# Patient Record
Sex: Female | Born: 2000 | Race: White | Hispanic: No | Marital: Single | Smoking: Never smoker
Health system: Southern US, Community
[De-identification: ages and names within clinical notes are randomized; demographics above are authoritative.]

## PROBLEM LIST (undated history)

## (undated) DIAGNOSIS — D649 Anemia, unspecified: Secondary | ICD-10-CM

## (undated) DIAGNOSIS — Z789 Other specified health status: Secondary | ICD-10-CM

## (undated) DIAGNOSIS — Z8619 Personal history of other infectious and parasitic diseases: Secondary | ICD-10-CM

## (undated) HISTORY — DX: Personal history of other infectious and parasitic diseases: Z86.19

## (undated) HISTORY — DX: Anemia, unspecified: D64.9

## (undated) HISTORY — DX: Other specified health status: Z78.9

## (undated) HISTORY — PX: NO PAST SURGERIES: SHX2092

---
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
## 2021-06-12 ENCOUNTER — Other Ambulatory Visit (HOSPITAL_COMMUNITY): Payer: Self-pay | Admitting: Family Medicine

## 2021-06-12 DIAGNOSIS — N63 Unspecified lump in unspecified breast: Secondary | ICD-10-CM

## 2021-06-17 ENCOUNTER — Encounter (HOSPITAL_COMMUNITY): Payer: Self-pay

## 2021-06-17 ENCOUNTER — Other Ambulatory Visit (HOSPITAL_COMMUNITY): Payer: Self-pay | Admitting: Family Medicine

## 2021-06-17 ENCOUNTER — Other Ambulatory Visit: Payer: Self-pay

## 2021-06-17 ENCOUNTER — Ambulatory Visit (HOSPITAL_COMMUNITY)
Admission: RE | Admit: 2021-06-17 | Discharge: 2021-06-17 | Disposition: A | Discharge status: Home / Self Care | Payer: Medicaid Other | Source: Ambulatory Visit | Attending: Family Medicine | Admitting: Family Medicine

## 2021-06-17 DIAGNOSIS — N63 Unspecified lump in unspecified breast: Secondary | ICD-10-CM

## 2021-06-24 ENCOUNTER — Ambulatory Visit (HOSPITAL_COMMUNITY): Admission: RE | Admit: 2021-06-24 | Payer: Self-pay | Source: Ambulatory Visit

## 2021-06-24 ENCOUNTER — Encounter (HOSPITAL_COMMUNITY): Payer: Self-pay

## 2021-07-01 ENCOUNTER — Other Ambulatory Visit: Payer: Self-pay | Admitting: Obstetrics & Gynecology

## 2021-07-01 DIAGNOSIS — O3680X0 Pregnancy with inconclusive fetal viability, not applicable or unspecified: Secondary | ICD-10-CM

## 2021-07-02 ENCOUNTER — Ambulatory Visit (INDEPENDENT_AMBULATORY_CARE_PROVIDER_SITE_OTHER): Payer: Medicaid Other

## 2021-07-02 ENCOUNTER — Other Ambulatory Visit: Payer: Self-pay

## 2021-07-02 DIAGNOSIS — Z3A08 8 weeks gestation of pregnancy: Secondary | ICD-10-CM

## 2021-07-02 DIAGNOSIS — O3680X0 Pregnancy with inconclusive fetal viability, not applicable or unspecified: Secondary | ICD-10-CM | POA: Diagnosis not present

## 2021-07-02 NOTE — Progress Notes (Signed)
[place] 8 wks,single IUP with YS,positive fht 140 bpm,normal ovaries,CRL 12.10 mm

## 2021-07-22 ENCOUNTER — Ambulatory Visit (HOSPITAL_COMMUNITY)
Admission: RE | Admit: 2021-07-22 | Discharge: 2021-07-22 | Disposition: A | Discharge status: Home / Self Care | Payer: Medicaid Other | Source: Ambulatory Visit | Attending: Family Medicine | Admitting: Family Medicine

## 2021-07-22 ENCOUNTER — Encounter (HOSPITAL_COMMUNITY): Payer: Self-pay

## 2021-07-22 ENCOUNTER — Other Ambulatory Visit: Payer: Self-pay

## 2021-07-22 DIAGNOSIS — N63 Unspecified lump in unspecified breast: Secondary | ICD-10-CM

## 2021-07-22 MED ORDER — LIDOCAINE HCL (PF) 2 % IJ SOLN
INTRAMUSCULAR | Status: AC
  Administered 2021-07-22: 10 mL
  Filled 2021-07-22: qty 10

## 2021-07-22 MED ORDER — SODIUM BICARBONATE 4.2 % IV SOLN
INTRAVENOUS | Status: AC
  Administered 2021-07-22: 2.5 meq
  Filled 2021-07-22: qty 10

## 2021-07-22 MED ORDER — LIDOCAINE-EPINEPHRINE (PF) 1 %-1:200000 IJ SOLN
INTRAMUSCULAR | Status: AC
  Administered 2021-07-22: 5 mL
  Filled 2021-07-22: qty 30

## 2021-07-22 NOTE — Progress Notes (Signed)
PT tolerated left breast biopsy and clip placement well today with no acute distress noted. PT verbalized understanding of discharge instructions and given an ice pack and paper discharge instructions. Samples for lab collected by MD and sent to the lab at this time by ultrasound tech.

## 2021-07-23 LAB — SURGICAL PATHOLOGY

## 2021-08-05 ENCOUNTER — Other Ambulatory Visit: Payer: Self-pay | Admitting: Obstetrics & Gynecology

## 2021-08-05 DIAGNOSIS — Z3682 Encounter for antenatal screening for nuchal translucency: Secondary | ICD-10-CM

## 2021-08-05 DIAGNOSIS — Z3400 Encounter for supervision of normal first pregnancy, unspecified trimester: Secondary | ICD-10-CM | POA: Insufficient documentation

## 2021-08-06 ENCOUNTER — Ambulatory Visit: Payer: Self-pay | Admitting: *Deleted

## 2021-08-06 ENCOUNTER — Encounter: Payer: Self-pay | Admitting: Advanced Practice Midwife

## 2021-08-06 ENCOUNTER — Other Ambulatory Visit: Payer: Self-pay

## 2021-08-06 ENCOUNTER — Ambulatory Visit (INDEPENDENT_AMBULATORY_CARE_PROVIDER_SITE_OTHER): Payer: Self-pay

## 2021-08-06 ENCOUNTER — Ambulatory Visit (INDEPENDENT_AMBULATORY_CARE_PROVIDER_SITE_OTHER): Payer: Medicaid Other | Admitting: Advanced Practice Midwife

## 2021-08-06 VITALS — BP 108/62 | HR 82 | Ht 64.0 in | Wt 118.0 lb

## 2021-08-06 DIAGNOSIS — Z31430 Encounter of female for testing for genetic disease carrier status for procreative management: Secondary | ICD-10-CM | POA: Diagnosis not present

## 2021-08-06 DIAGNOSIS — Z3A13 13 weeks gestation of pregnancy: Secondary | ICD-10-CM

## 2021-08-06 DIAGNOSIS — Z3402 Encounter for supervision of normal first pregnancy, second trimester: Secondary | ICD-10-CM

## 2021-08-06 DIAGNOSIS — Z3401 Encounter for supervision of normal first pregnancy, first trimester: Secondary | ICD-10-CM

## 2021-08-06 DIAGNOSIS — Z3682 Encounter for antenatal screening for nuchal translucency: Secondary | ICD-10-CM

## 2021-08-06 DIAGNOSIS — Z363 Encounter for antenatal screening for malformations: Secondary | ICD-10-CM

## 2021-08-06 LAB — POCT URINALYSIS DIPSTICK OB
Blood, UA: NEGATIVE
Glucose, UA: NEGATIVE
Ketones, UA: NEGATIVE
Leukocytes, UA: NEGATIVE
Nitrite, UA: NEGATIVE
POC,PROTEIN,UA: NEGATIVE

## 2021-08-06 MED ORDER — BLOOD PRESSURE MONITOR MISC
0 refills | Status: DC
Start: 2021-08-06 — End: 2023-12-02

## 2021-08-06 NOTE — Patient Instructions (Signed)
Barb, thank you for choosing our office today! We appreciate the opportunity to meet your healthcare needs. You may receive a short survey by mail, e-mail, or through MyChart. If you are happy with your care we would appreciate if you could take just a few minutes to complete the survey questions. We read all of your comments and take your feedback very seriously. Thank you again for choosing our office.  [facility] [place] ([address] [place],  [address]) Entrance C, located off of [address] Free 24/7 valet parking   Nausea & Vomiting Have saltine crackers or pretzels by your bed and eat a few bites before you raise your head out of bed in the morning Eat small frequent meals throughout the day instead of large meals Drink plenty of fluids throughout the day to stay hydrated, just don't drink a lot of fluids with your meals.  This can make your stomach fill up faster making you feel sick Do not brush your teeth right after you eat Products with real ginger are good for nausea, like ginger ale and ginger hard candy Make sure it says made with real ginger! Sucking on sour candy like lemon heads is also good for nausea If your prenatal vitamins make you nauseated, take them at night so you will sleep through the nausea Sea Bands If you feel like you need medicine for the nausea & vomiting please let us know If you are unable to keep any fluids or food down please let us know   Constipation Drink plenty of fluid, preferably water, throughout the day Eat foods high in fiber such as fruits, vegetables, and grains Exercise, such as walking, is a good way to keep your bowels regular Drink warm fluids, especially warm prune juice, or decaf coffee Eat a 1/2 cup of real oatmeal (not instant), 1/2 cup applesauce, and 1/2-1 cup warm prune juice every day If needed, you may take Colace (docusate sodium) stool softener  once or twice a day to help keep the stool soft.  If you still are having problems with constipation, you may take Miralax once daily as needed to help keep your bowels regular.   Home Blood Pressure Monitoring for Patients   Your provider has recommended that you check your blood pressure (BP) at least once a week at home. If you do not have a blood pressure cuff at home, one will be provided for you. Contact your provider if you have not received your monitor within 1 week.   Helpful Tips for Accurate Home Blood Pressure Checks  Don't smoke, exercise, or drink caffeine 30 minutes before checking your BP Use the restroom before checking your BP (a full bladder can raise your pressure) Relax in a comfortable upright chair Feet on the ground Left arm resting comfortably on a flat surface at the level of your heart Legs uncrossed Back supported Sit quietly and don't talk Place the cuff on your bare arm Adjust snuggly, so that only two fingertips can fit between your skin and the top of the cuff Check 2 readings separated by at least one minute Keep a log of your BP readings For a visual, please reference this diagram: http://ccnc.care/bpdiagram  Provider Name: [facility]     Phone: [phone]  Zone 1: ALL CLEAR  Continue to monitor your symptoms:  BP reading is less than 140 (top number) or less than 90 (bottom   number)  No right upper stomach pain No headaches or seeing spots No feeling nauseated or throwing up No swelling in face and hands  Zone 2: CAUTION Call your doctor's office for any of the following:  BP reading is greater than 140 (top number) or greater than 90 (bottom number)  Stomach pain under your ribs in the middle or right side Headaches or seeing spots Feeling nauseated or throwing up Swelling in face and hands  Zone 3: EMERGENCY  Seek immediate medical care if you have any of the following:  BP reading is greater than160 (top number) or greater than  110 (bottom number) Severe headaches not improving with Tylenol Serious difficulty catching your breath Any worsening symptoms from Zone 2    First Trimester of Pregnancy The first trimester of pregnancy is from week 1 until the end of week 12 (months 1 through 3). A week after a sperm fertilizes an egg, the egg will implant on the wall of the uterus. This embryo will begin to develop into a baby. Genes from you and your partner are forming the baby. The female genes determine whether the baby is a boy or a girl. At 6-8 weeks, the eyes and face are formed, and the heartbeat can be seen on ultrasound. At the end of 12 weeks, all the baby's organs are formed.  Now that you are pregnant, you will want to do everything you can to have a healthy baby. Two of the most important things are to get good prenatal care and to follow your health care provider's instructions. Prenatal care is all the medical care you receive before the baby's birth. This care will help prevent, find, and treat any problems during the pregnancy and childbirth. BODY CHANGES Your body goes through many changes during pregnancy. The changes vary from woman to woman.  You may gain or lose a couple of pounds at first. You may feel sick to your stomach (nauseous) and throw up (vomit). If the vomiting is uncontrollable, call your health care provider. You may tire easily. You may develop headaches that can be relieved by medicines approved by your health care provider. You may urinate more often. Painful urination may mean you have a bladder infection. You may develop heartburn as a result of your pregnancy. You may develop constipation because certain hormones are causing the muscles that push waste through your intestines to slow down. You may develop hemorrhoids or swollen, bulging veins (varicose veins). Your breasts may begin to grow larger and become tender. Your nipples may stick out more, and the tissue that surrounds them  (areola) may become darker. Your gums may bleed and may be sensitive to brushing and flossing. Dark spots or blotches (chloasma, mask of pregnancy) may develop on your face. This will likely fade after the baby is born. Your menstrual periods will stop. You may have a loss of appetite. You may develop cravings for certain kinds of food. You may have changes in your emotions from day to day, such as being excited to be pregnant or being concerned that something may go wrong with the pregnancy and baby. You may have more vivid and strange dreams. You may have changes in your hair. These can include thickening of your hair, rapid growth, and changes in texture. Some women also have hair loss during or after pregnancy, or hair that feels dry or thin. Your hair will most likely return to normal after your baby is born. WHAT TO EXPECT AT YOUR PRENATAL   VISITS During a routine prenatal visit: You will be weighed to make sure you and the baby are growing normally. Your blood pressure will be taken. Your abdomen will be measured to track your baby's growth. The fetal heartbeat will be listened to starting around week 10 or 12 of your pregnancy. Test results from any previous visits will be discussed. Your health care provider may ask you: How you are feeling. If you are feeling the baby move. If you have had any abnormal symptoms, such as leaking fluid, bleeding, severe headaches, or abdominal cramping. If you have any questions. Other tests that may be performed during your first trimester include: Blood tests to find your blood type and to check for the presence of any previous infections. They will also be used to check for low iron levels (anemia) and Rh antibodies. Later in the pregnancy, blood tests for diabetes will be done along with other tests if problems develop. Urine tests to check for infections, diabetes, or protein in the urine. An ultrasound to confirm the proper growth and development  of the baby. An amniocentesis to check for possible genetic problems. Fetal screens for spina bifida and Down syndrome. You may need other tests to make sure you and the baby are doing well. HOME CARE INSTRUCTIONS  Medicines Follow your health care provider's instructions regarding medicine use. Specific medicines may be either safe or unsafe to take during pregnancy. Take your prenatal vitamins as directed. If you develop constipation, try taking a stool softener if your health care provider approves. Diet Eat regular, well-balanced meals. Choose a variety of foods, such as meat or vegetable-based protein, fish, milk and low-fat dairy products, vegetables, fruits, and whole grain breads and cereals. Your health care provider will help you determine the amount of weight gain that is right for you. Avoid raw meat and uncooked cheese. These carry germs that can cause birth defects in the baby. Eating four or five small meals rather than three large meals a day may help relieve nausea and vomiting. If you start to feel nauseous, eating a few soda crackers can be helpful. Drinking liquids between meals instead of during meals also seems to help nausea and vomiting. If you develop constipation, eat more high-fiber foods, such as fresh vegetables or fruit and whole grains. Drink enough fluids to keep your urine clear or pale yellow. Activity and Exercise Exercise only as directed by your health care provider. Exercising will help you: Control your weight. Stay in shape. Be prepared for labor and delivery. Experiencing pain or cramping in the lower abdomen or low back is a good sign that you should stop exercising. Check with your health care provider before continuing normal exercises. Try to avoid standing for long periods of time. Move your legs often if you must stand in one place for a long time. Avoid heavy lifting. Wear low-heeled shoes, and practice good posture. You may continue to have sex  unless your health care provider directs you otherwise. Relief of Pain or Discomfort Wear a good support bra for breast tenderness.   Take warm sitz baths to soothe any pain or discomfort caused by hemorrhoids. Use hemorrhoid cream if your health care provider approves.   Rest with your legs elevated if you have leg cramps or low back pain. If you develop varicose veins in your legs, wear support hose. Elevate your feet for 15 minutes, 3-4 times a day. Limit salt in your diet. Prenatal Care Schedule your prenatal visits by the   twelfth week of pregnancy. They are usually scheduled monthly at first, then more often in the last 2 months before delivery. Write down your questions. Take them to your prenatal visits. Keep all your prenatal visits as directed by your health care provider. Safety Wear your seat belt at all times when driving. Make a list of emergency phone numbers, including numbers for family, friends, the hospital, and police and fire departments. General Tips Ask your health care provider for a referral to a local prenatal education class. Begin classes no later than at the beginning of month 6 of your pregnancy. Ask for help if you have counseling or nutritional needs during pregnancy. Your health care provider can offer advice or refer you to specialists for help with various needs. Do not use hot tubs, steam rooms, or saunas. Do not douche or use tampons or scented sanitary pads. Do not cross your legs for long periods of time. Avoid cat litter boxes and soil used by cats. These carry germs that can cause birth defects in the baby and possibly loss of the fetus by miscarriage or stillbirth. Avoid all smoking, herbs, alcohol, and medicines not prescribed by your health care provider. Chemicals in these affect the formation and growth of the baby. Schedule a dentist appointment. At home, brush your teeth with a soft toothbrush and be gentle when you floss. SEEK MEDICAL CARE IF:   You have dizziness. You have mild pelvic cramps, pelvic pressure, or nagging pain in the abdominal area. You have persistent nausea, vomiting, or diarrhea. You have a bad smelling vaginal discharge. You have pain with urination. You notice increased swelling in your face, hands, legs, or ankles. SEEK IMMEDIATE MEDICAL CARE IF:  You have a fever. You are leaking fluid from your vagina. You have spotting or bleeding from your vagina. You have severe abdominal cramping or pain. You have rapid weight gain or loss. You vomit blood or material that looks like coffee grounds. You are exposed to German measles and have never had them. You are exposed to fifth disease or chickenpox. You develop a severe headache. You have shortness of breath. You have any kind of trauma, such as from a fall or a car accident. Document Released: 09/08/2001 Document Revised: 01/29/2014 Document Reviewed: 07/25/2013 [employer] Patient Information 2015 [employer]. This information is not intended to replace advice given to you by your health care provider. Make sure you discuss any questions you have with your health care provider.  

## 2021-08-06 NOTE — Progress Notes (Signed)
[place] 13 wks,measurements c/w dates,CRL 64.54 mm,NB present,NT 1.5 mm,normal ovaries,fhr 159 bpm,posterior placenta

## 2021-08-06 NOTE — Progress Notes (Signed)
INITIAL OBSTETRICAL VISIT Patient name: Megan Sandoval MRN [id number]  Date of birth: 06-Apr-2001 Chief Complaint:   Initial Prenatal Visit  History of Present Illness:   Megan Sandoval is a 20 y.o. G1P0 Caucasian female at [redacted]w[redacted]d by LMP c/w u/s at 8.0 weeks with an Estimated Date of Delivery: 02/11/22 being seen today for her initial obstetrical visit.   Patient's last menstrual period was 05/07/2021 (exact date). Her obstetrical history is significant for primigravida.   Today she reports no complaints.  Last pap <21yo. Results were: N/A  Depression screen [facility] 2/9 08/06/2021  Decreased Interest 1  Down, Depressed, Hopeless 1  PHQ - 2 Score 2  Altered sleeping 0  Tired, decreased energy 1  Change in appetite 3  Feeling bad or failure about yourself  0  Trouble concentrating 0  Moving slowly or fidgety/restless 0  Suicidal thoughts 0  PHQ-9 Score 6     GAD 7 : Generalized Anxiety Score 08/06/2021  Nervous, Anxious, on Edge 1  Control/stop worrying 1  Worry too much - different things 1  Trouble relaxing 1  Restless 1  Easily annoyed or irritable 1  Afraid - awful might happen 0  Total GAD 7 Score 6     Review of Systems:   Pertinent items are noted in HPI Denies cramping/contractions, leakage of fluid, vaginal bleeding, abnormal vaginal discharge w/ itching/odor/irritation, headaches, visual changes, shortness of breath, chest pain, abdominal pain, severe nausea/vomiting, or problems with urination or bowel movements unless otherwise stated above.  Pertinent History Reviewed:  Reviewed past medical,surgical, social, obstetrical and family history.  Reviewed problem list, medications and allergies. OB History  Gravida Para Term Preterm AB Living  1            SAB IAB Ectopic Multiple Live Births               # Outcome Date GA Lbr Len/2nd Weight Sex Delivery Anes PTL Lv  1 Current            Physical Assessment:   Vitals:   08/06/21 1336  BP: 108/62  Pulse:  82  Weight: 118 lb (53.5 kg)  There is no height or weight on file to calculate BMI.       Physical Examination:  General appearance - well appearing, and in no distress  Mental status - alert, oriented to person, place, and time  Psych:  She has a normal mood and affect  Skin - warm and dry, normal color, no suspicious lesions noted  Chest - effort normal, all lung fields clear to auscultation bilaterally  Heart - normal rate and regular rhythm  Abdomen - soft, nontender  Extremities:  No swelling or varicosities noted  Pelvic - not indicated  Thin prep pap is not done    TODAY'S NT Korea 13 wks,measurements c/w dates,CRL 64.54 mm,NB present,NT 1.5 mm,normal ovaries,fhr 159 bpm,posterior placenta   Results for orders placed or performed in visit on 08/06/21 (from the past 24 hour(s))  POC Urinalysis Dipstick OB   Collection Time: 08/06/21  2:42 PM  Result Value Ref Range   Color, UA     Clarity, UA     Glucose, UA Negative Negative   Bilirubin, UA     Ketones, UA neg    Spec Grav, UA     Blood, UA neg    pH, UA     POC,PROTEIN,UA Negative Negative, Trace, Small (1+), Moderate (2+), Large (3+), 4+   Urobilinogen,  UA     Nitrite, UA neg    Leukocytes, UA Negative Negative   Appearance     Odor      Assessment & Plan:  1) Low-Risk Pregnancy G1P0 at [redacted]w[redacted]d with an Estimated Date of Delivery: 02/11/22   2) Initial OB visit   Meds:  Meds ordered this encounter  Medications   Blood Pressure Monitor MISC    Sig: For regular home bp monitoring during pregnancy    Dispense:  1 each    Refill:  0    Z34.81 Please mail to patient    Initial labs obtained Continue prenatal vitamins Reviewed n/v relief measures and warning s/s to report Reviewed recommended weight gain based on pre-gravid BMI Encouraged well-balanced diet Genetic & carrier screening discussed: requests Panorama and NT/IT, requests Horizon  Ultrasound discussed; fetal survey: requested CCNC completed> form  faxed if has or is planning to apply for medicaid The nature of [place] [facility] [employer] with multiple MDs and other Advanced Practice Providers was explained to patient; also emphasized that fellows, residents, and students are part of our team. Does not have home bp cuff. Office bp cuff given: no. Rx sent: yes. Check bp weekly, let us know if consistently >140/90.   No indications for ASA therapy or early HgbA1c (per uptodate)   Follow-up: Return in about 4 weeks (around 09/03/2021) for 4wk LROB, 2nd IT, in person; then 7wk LROB with anatomy u/s.   Orders Placed This Encounter  Procedures   Urine Culture   GC/Chlamydia Probe Amp   US OB Comp + 14 Wk   Integrated 1   Pain Management Screening Profile (10S)   Genetic Screening   CBC/D/Plt+RPR+Rh+ABO+RubIgG...   POC Urinalysis Dipstick OB    Arabella Merles [facility] 08/06/2021 2:59 PM

## 2021-08-07 LAB — MED LIST OPTION NOT SELECTED

## 2021-08-08 LAB — PMP SCREEN PROFILE (10S), URINE
Amphetamine Scrn, Ur: NEGATIVE ng/mL
BARBITURATE SCREEN URINE: NEGATIVE ng/mL
BENZODIAZEPINE SCREEN, URINE: NEGATIVE ng/mL
CANNABINOIDS UR QL SCN: POSITIVE ng/mL — AB
Cocaine (Metab) Scrn, Ur: NEGATIVE ng/mL
Creatinine(Crt), U: 64.5 mg/dL (ref 20.0–300.0)
Methadone Screen, Urine: NEGATIVE ng/mL
OXYCODONE+OXYMORPHONE UR QL SCN: NEGATIVE ng/mL
Opiate Scrn, Ur: NEGATIVE ng/mL
Ph of Urine: 7.3 (ref 4.5–8.9)
Phencyclidine Qn, Ur: NEGATIVE ng/mL
Propoxyphene Scrn, Ur: NEGATIVE ng/mL

## 2021-08-08 LAB — CBC/D/PLT+RPR+RH+ABO+RUBIGG...
Antibody Screen: NEGATIVE
Basophils Absolute: 0 10*3/uL (ref 0.0–0.2)
Basos: 0 %
EOS (ABSOLUTE): 0 10*3/uL (ref 0.0–0.4)
Eos: 1 %
HCV Ab: <0.1 s/co ratio (ref 0.0–0.9)
HIV Screen 4th Generation wRfx: NONREACTIVE
Hematocrit: 32.3 % — ABNORMAL LOW (ref 34.0–46.6)
Hemoglobin: 11.3 g/dL (ref 11.1–15.9)
Hepatitis B Surface Ag: NEGATIVE
Immature Grans (Abs): 0 10*3/uL (ref 0.0–0.1)
Immature Granulocytes: 0 %
Lymphocytes Absolute: 1.7 10*3/uL (ref 0.7–3.1)
Lymphs: 22 %
MCH: 30.3 pg (ref 26.6–33.0)
MCHC: 35 g/dL (ref 31.5–35.7)
MCV: 87 fL (ref 79–97)
Monocytes Absolute: 0.5 10*3/uL (ref 0.1–0.9)
Monocytes: 6 %
Neutrophils Absolute: 5.3 10*3/uL (ref 1.4–7.0)
Neutrophils: 71 %
Platelets: 274 10*3/uL (ref 150–450)
RBC: 3.73 x10E6/uL — ABNORMAL LOW (ref 3.77–5.28)
RDW: 11.7 % (ref 11.7–15.4)
RPR Ser Ql: NONREACTIVE
Rh Factor: POSITIVE
Rubella Antibodies, IGG: 1.17 index (ref >0.99)
WBC: 7.5 10*3/uL (ref 3.4–10.8)

## 2021-08-08 LAB — INTEGRATED 1
Crown Rump Length: 64.5 mm
Gest. Age on Collection Date: 12.6 weeks
Maternal Age at EDD: 20 yr
Nuchal Translucency (NT): 1.5 mm
Number of Fetuses: 1
PAPP-A Value: 3321.9 ng/mL
Weight: 118 [lb_av]

## 2021-08-08 LAB — GC/CHLAMYDIA PROBE AMP
Chlamydia trachomatis, NAA: NEGATIVE
Neisseria Gonorrhoeae by PCR: NEGATIVE

## 2021-08-08 LAB — HCV INTERPRETATION

## 2021-08-12 ENCOUNTER — Other Ambulatory Visit: Payer: Self-pay | Admitting: Advanced Practice Midwife

## 2021-08-12 ENCOUNTER — Encounter: Payer: Self-pay | Admitting: Advanced Practice Midwife

## 2021-08-12 ENCOUNTER — Telehealth: Payer: Self-pay | Admitting: *Deleted

## 2021-08-12 DIAGNOSIS — R8271 Bacteriuria: Secondary | ICD-10-CM | POA: Insufficient documentation

## 2021-08-12 DIAGNOSIS — F1290 Cannabis use, unspecified, uncomplicated: Secondary | ICD-10-CM | POA: Insufficient documentation

## 2021-08-12 LAB — URINE CULTURE

## 2021-08-12 MED ORDER — NITROFURANTOIN MONOHYD MACRO 100 MG PO CAPS: 100.0000 mg | ORAL_CAPSULE | Freq: Two times a day (BID) | ORAL | 0 refills | Status: DC

## 2021-08-12 NOTE — Telephone Encounter (Signed)
Patient notified of urine culture positive for e-coli and antibiotics have been sent in.  Advised to take all of the medication and urine will be sent at next visit for POC.  Pt verbalized understanding.

## 2021-08-19 ENCOUNTER — Encounter: Payer: Self-pay | Admitting: Advanced Practice Midwife

## 2021-09-03 ENCOUNTER — Ambulatory Visit (INDEPENDENT_AMBULATORY_CARE_PROVIDER_SITE_OTHER): Payer: Medicaid Other | Admitting: Advanced Practice Midwife

## 2021-09-03 ENCOUNTER — Other Ambulatory Visit: Payer: Self-pay

## 2021-09-03 VITALS — BP 97/56 | HR 88 | Wt 119.0 lb

## 2021-09-03 DIAGNOSIS — Z3A17 17 weeks gestation of pregnancy: Secondary | ICD-10-CM

## 2021-09-03 DIAGNOSIS — Z1379 Encounter for other screening for genetic and chromosomal anomalies: Secondary | ICD-10-CM

## 2021-09-03 DIAGNOSIS — R8271 Bacteriuria: Secondary | ICD-10-CM

## 2021-09-03 DIAGNOSIS — F1290 Cannabis use, unspecified, uncomplicated: Secondary | ICD-10-CM

## 2021-09-03 DIAGNOSIS — O2342 Unspecified infection of urinary tract in pregnancy, second trimester: Secondary | ICD-10-CM

## 2021-09-03 DIAGNOSIS — Z3402 Encounter for supervision of normal first pregnancy, second trimester: Secondary | ICD-10-CM

## 2021-09-03 NOTE — Patient Instructions (Signed)
Megan Sandoval, thank you for choosing our office today! We appreciate the opportunity to meet your healthcare needs. You may receive a short survey by mail, e-mail, or through MyChart. If you are happy with your care we would appreciate if you could take just a few minutes to complete the survey questions. We read all of your comments and take your feedback very seriously. Thank you again for choosing our office.  [facility] Team at [facility] ([address] [place], [place] [address]) Entrance C, located off of [address] Free 24/7 valet parking  Go to [email or web address] to register for FREE online childbirth classes  Call the office ([phone]) or go to [facility] if: You begin to severe cramping Your water breaks.  Sometimes it is a big gush of fluid, sometimes it is just a trickle that keeps getting your panties wet or running down your legs You have vaginal bleeding.  It is normal to have a small amount of spotting if your cervix was checked.   [place] Pediatricians/Family Doctors [facility] ([facility]): [address], [phone]           [facility]: [address], [phone]                [facility] ([facility]): [address], [phone] (call to ask if accepting patients) [facility]: [address], [place], [phone]    [place] Pediatricians/Family Doctors [facility] ([facility]): [address], [phone] [facility]: [address], [phone] [facility]: [address], [phone]  [place] [facility]  [facility] ([facility]): [phone] [facility]: [address], [phone]   [place] Family Doctors [facility]: [address], [phone]  [place] [facility]  [facility]: [phone], [phone]  Home Blood Pressure Monitoring for Patients   Your provider has recommended that you check your blood pressure (BP) at least once a week at home. If you do not have a blood pressure cuff at home, one will be provided for you. Contact your provider if you have not received your monitor within 1 week.   Helpful Tips for Accurate Home Blood Pressure Checks  Don't smoke, exercise, or drink caffeine 30 minutes before checking your BP Use the restroom before checking your BP (a full bladder can raise your pressure) Relax in a comfortable upright chair Feet on the ground Left arm resting comfortably on a flat surface at the level of your heart Legs uncrossed Back supported Sit quietly and don't talk Place the cuff on your bare arm Adjust snuggly, so that only two fingertips can fit between your skin and the top of the cuff Check 2 readings separated by at least one minute Keep a log of your BP readings For a visual, please reference this diagram: http://ccnc.care/bpdiagram  Provider Name: [facility]     Phone: [phone]  Zone 1: ALL CLEAR  Continue to monitor your symptoms:  BP reading is less than 140 (top number) or less than 90 (bottom number)  No right upper stomach pain No headaches or seeing spots No feeling nauseated or throwing up No swelling in face and hands  Zone 2: CAUTION Call your doctor's office for any of the following:  BP reading is greater than 140 (top number) or greater than   90 (bottom number)  Stomach pain under your ribs in the middle or right side Headaches or seeing spots Feeling nauseated or throwing up Swelling in face and hands  Zone 3: EMERGENCY  Seek immediate medical care if you have any of the following:  BP reading is greater than160 (top number) or greater than 110 (bottom number) Severe headaches not improving with Tylenol Serious difficulty catching your breath Any worsening symptoms from  Zone 2     Second Trimester of Pregnancy The second trimester is from week 14 through week 27 (months 4 through 6). The second trimester is often a time when you feel your best. Your body has adjusted to being pregnant, and you begin to feel better physically. Usually, morning sickness has lessened or quit completely, you may have more energy, and you may have an increase in appetite. The second trimester is also a time when the fetus is growing rapidly. At the end of the sixth month, the fetus is about 9 inches long and weighs about 1 pounds. You will likely begin to feel the baby move (quickening) between 16 and 20 weeks of pregnancy. Body changes during your second trimester Your body continues to go through many changes during your second trimester. The changes vary from woman to woman. Your weight will continue to increase. You will notice your lower abdomen bulging out. You may begin to get stretch marks on your hips, abdomen, and breasts. You may develop headaches that can be relieved by medicines. The medicines should be approved by your health care provider. You may urinate more often because the fetus is pressing on your bladder. You may develop or continue to have heartburn as a result of your pregnancy. You may develop constipation because certain hormones are causing the muscles that push waste through your intestines to slow down. You may develop hemorrhoids or swollen, bulging veins (varicose veins). You may have back pain. This is caused by: Weight gain. Pregnancy hormones that are relaxing the joints in your pelvis. A shift in weight and the muscles that support your balance. Your breasts will continue to grow and they will continue to become tender. Your gums may bleed and may be sensitive to brushing and flossing. Dark spots or blotches (chloasma, mask of pregnancy) may develop on your face. This will likely fade after the baby is born. A dark line from your belly button to  the pubic area (linea nigra) may appear. This will likely fade after the baby is born. You may have changes in your hair. These can include thickening of your hair, rapid growth, and changes in texture. Some women also have hair loss during or after pregnancy, or hair that feels dry or thin. Your hair will most likely return to normal after your baby is born.  What to expect at prenatal visits During a routine prenatal visit: You will be weighed to make sure you and the fetus are growing normally. Your blood pressure will be taken. Your abdomen will be measured to track your baby's growth. The fetal heartbeat will be listened to. Any test results from the previous visit will be discussed.  Your health care provider may ask you: How you are feeling. If you are feeling the baby move. If you have had any abnormal symptoms, such as leaking fluid, bleeding, severe headaches, or abdominal cramping. If you are using any tobacco products, including cigarettes, chewing tobacco, and electronic cigarettes. If you have any questions.  Other tests that may be performed during   your second trimester include: Blood tests that check for: Low iron levels (anemia). High blood sugar that affects pregnant women (gestational diabetes) between 24 and 28 weeks. Rh antibodies. This is to check for a protein on red blood cells (Rh factor). Urine tests to check for infections, diabetes, or protein in the urine. An ultrasound to confirm the proper growth and development of the baby. An amniocentesis to check for possible genetic problems. Fetal screens for spina bifida and Down syndrome. HIV (human immunodeficiency virus) testing. Routine prenatal testing includes screening for HIV, unless you choose not to have this test.  Follow these instructions at home: Medicines Follow your health care provider's instructions regarding medicine use. Specific medicines may be either safe or unsafe to take during  pregnancy. Take a prenatal vitamin that contains at least 600 micrograms (mcg) of folic acid. If you develop constipation, try taking a stool softener if your health care provider approves. Eating and drinking Eat a balanced diet that includes fresh fruits and vegetables, whole grains, good sources of protein such as meat, eggs, or tofu, and low-fat dairy. Your health care provider will help you determine the amount of weight gain that is right for you. Avoid raw meat and uncooked cheese. These carry germs that can cause birth defects in the baby. If you have low calcium intake from food, talk to your health care provider about whether you should take a daily calcium supplement. Limit foods that are high in fat and processed sugars, such as fried and sweet foods. To prevent constipation: Drink enough fluid to keep your urine clear or pale yellow. Eat foods that are high in fiber, such as fresh fruits and vegetables, whole grains, and beans. Activity Exercise only as directed by your health care provider. Most women can continue their usual exercise routine during pregnancy. Try to exercise for 30 minutes at least 5 days a week. Stop exercising if you experience uterine contractions. Avoid heavy lifting, wear low heel shoes, and practice good posture. A sexual relationship may be continued unless your health care provider directs you otherwise. Relieving pain and discomfort Wear a good support bra to prevent discomfort from breast tenderness. Take warm sitz baths to soothe any pain or discomfort caused by hemorrhoids. Use hemorrhoid cream if your health care provider approves. Rest with your legs elevated if you have leg cramps or low back pain. If you develop varicose veins, wear support hose. Elevate your feet for 15 minutes, 3-4 times a day. Limit salt in your diet. Prenatal Care Write down your questions. Take them to your prenatal visits. Keep all your prenatal visits as told by your health  care provider. This is important. Safety Wear your seat belt at all times when driving. Make a list of emergency phone numbers, including numbers for family, friends, the hospital, and police and fire departments. General instructions Ask your health care provider for a referral to a local prenatal education class. Begin classes no later than the beginning of month 6 of your pregnancy. Ask for help if you have counseling or nutritional needs during pregnancy. Your health care provider can offer advice or refer you to specialists for help with various needs. Do not use hot tubs, steam rooms, or saunas. Do not douche or use tampons or scented sanitary pads. Do not cross your legs for long periods of time. Avoid cat litter boxes and soil used by cats. These carry germs that can cause birth defects in the baby and possibly loss of the   fetus by miscarriage or stillbirth. Avoid all smoking, herbs, alcohol, and unprescribed drugs. Chemicals in these products can affect the formation and growth of the baby. Do not use any products that contain nicotine or tobacco, such as cigarettes and e-cigarettes. If you need help quitting, ask your health care provider. Visit your dentist if you have not gone yet during your pregnancy. Use a soft toothbrush to brush your teeth and be gentle when you floss. Contact a health care provider if: You have dizziness. You have mild pelvic cramps, pelvic pressure, or nagging pain in the abdominal area. You have persistent nausea, vomiting, or diarrhea. You have a bad smelling vaginal discharge. You have pain when you urinate. Get help right away if: You have a fever. You are leaking fluid from your vagina. You have spotting or bleeding from your vagina. You have severe abdominal cramping or pain. You have rapid weight gain or weight loss. You have shortness of breath with chest pain. You notice sudden or extreme swelling of your face, hands, ankles, feet, or legs. You  have not felt your baby move in over an hour. You have severe headaches that do not go away when you take medicine. You have vision changes. Summary The second trimester is from week 14 through week 27 (months 4 through 6). It is also a time when the fetus is growing rapidly. Your body goes through many changes during pregnancy. The changes vary from woman to woman. Avoid all smoking, herbs, alcohol, and unprescribed drugs. These chemicals affect the formation and growth your baby. Do not use any tobacco products, such as cigarettes, chewing tobacco, and e-cigarettes. If you need help quitting, ask your health care provider. Contact your health care provider if you have any questions. Keep all prenatal visits as told by your health care provider. This is important. This information is not intended to replace advice given to you by your health care provider. Make sure you discuss any questions you have with your health care provider. Document Released: 09/08/2001 Document Revised: 02/20/2016 Document Reviewed: 11/15/2012 Elsevier Interactive Patient Education  2017 [employer]vier Inc.  

## 2021-09-03 NOTE — Progress Notes (Signed)
   LOW-RISK PREGNANCY VISIT Patient name: Megan Sandoval MRN [id number]  Date of birth: [birth year] Chief Complaint:   Routine Prenatal Visit  History of Present Illness:   Megan Sandoval is a 20 y.o. G1P0 female at [redacted]w[redacted]d with an Estimated Date of Delivery: 02/11/22 being seen today for ongoing management of a low-risk pregnancy.  Today she reports no complaints. Contractions: Not present. Vag. Bleeding: None.  Movement: Absent. denies leaking of fluid. Review of Systems:   Pertinent items are noted in HPI Denies abnormal vaginal discharge w/ itching/odor/irritation, headaches, visual changes, shortness of breath, chest pain, abdominal pain, severe nausea/vomiting, or problems with urination or bowel movements unless otherwise stated above. Pertinent History Reviewed:  Reviewed past medical,surgical, social, obstetrical and family history.  Reviewed problem list, medications and allergies. Physical Assessment:   Vitals:   09/03/21 1349  BP: (!) 97/56  Pulse: 88  Weight: 119 lb (54 kg)  Body mass index is 20.43 kg/m.        Physical Examination:   General appearance: Well appearing, and in no distress  Mental status: Alert, oriented to person, place, and time  Skin: Warm & dry  Cardiovascular: Normal heart rate noted  Respiratory: Normal respiratory effort, no distress  Abdomen: Soft, gravid, nontender  Pelvic: Cervical exam deferred         Extremities: Edema: None  Fetal Status: Fetal Heart Rate (bpm): 154   Movement: Absent    No results found for this or any previous visit (from the past 24 hour(s)).  Assessment & Plan:  1) Low-risk pregnancy G1P0 at [redacted]w[redacted]d with an Estimated Date of Delivery: 02/11/22   2) Previous ASB, completed meds; POC today  3) +THC, counseled re links between [facility] and childhood development concerns and recommended that she stop   Meds: No orders of the defined types were placed in this encounter.  Labs/procedures today: urine culture, 2nd  IT  Plan:  Continue routine obstetrical care   Reviewed: Preterm labor symptoms and general obstetric precautions including but not limited to vaginal bleeding, contractions, leaking of fluid and fetal movement were reviewed in detail with the patient.  All questions were answered. Didn't ask about home bp cuff.  Check bp weekly, let us know if >140/90.   Follow-up: Return for 3-4 weeks LROB with anatomy u/s.  Orders Placed This Encounter  Procedures   Urine Culture   INTEGRATED 2   Arabella Merles [facility] 09/03/2021 2:08 PM

## 2021-09-05 LAB — INTEGRATED 2
AFP MoM: 1.25
Alpha-Fetoprotein: 48.6 ng/mL
Crown Rump Length: 64.5 mm
DIA MoM: 0.59
DIA Value: 100.4 pg/mL
Estriol, Unconjugated: 1.16 ng/mL
Gest. Age on Collection Date: 12.6 weeks
Gestational Age: 16.6 weeks
Maternal Age at EDD: 20 yr
Nuchal Translucency (NT): 1.5 mm
Nuchal Translucency MoM: 1.04
Number of Fetuses: 1
PAPP-A MoM: 2.55
PAPP-A Value: 3321.9 ng/mL
Test Results:: NEGATIVE
Weight: 118 [lb_av]
Weight: 130 [lb_av]
hCG MoM: 1.9
hCG Value: 70.4 IU/mL
uE3 MoM: 1.04

## 2021-09-06 LAB — URINE CULTURE

## 2021-09-08 ENCOUNTER — Other Ambulatory Visit: Payer: Self-pay | Admitting: Advanced Practice Midwife

## 2021-09-08 DIAGNOSIS — R8271 Bacteriuria: Secondary | ICD-10-CM

## 2021-09-08 DIAGNOSIS — Z3402 Encounter for supervision of normal first pregnancy, second trimester: Secondary | ICD-10-CM

## 2021-09-08 MED ORDER — AMOXICILLIN-POT CLAVULANATE 875-125 MG PO TABS: 1.0000 | ORAL_TABLET | Freq: Two times a day (BID) | ORAL | 0 refills | Status: AC

## 2021-09-12 ENCOUNTER — Telehealth: Payer: Self-pay | Admitting: *Deleted

## 2021-09-12 NOTE — Telephone Encounter (Signed)
Patient made aware urine +EColi, antibiotics sent to pharmacy. Advised to take all of the medication even if symptoms have resolved.  Pt verbalized understanding with no further questions.

## 2021-09-24 ENCOUNTER — Ambulatory Visit (INDEPENDENT_AMBULATORY_CARE_PROVIDER_SITE_OTHER): Payer: Medicaid Other | Admitting: Obstetrics & Gynecology

## 2021-09-24 ENCOUNTER — Other Ambulatory Visit: Payer: Self-pay

## 2021-09-24 ENCOUNTER — Ambulatory Visit (INDEPENDENT_AMBULATORY_CARE_PROVIDER_SITE_OTHER): Payer: Medicaid Other

## 2021-09-24 ENCOUNTER — Encounter: Payer: Self-pay | Admitting: Obstetrics & Gynecology

## 2021-09-24 VITALS — BP 110/63 | HR 83 | Wt 122.0 lb

## 2021-09-24 DIAGNOSIS — B9620 Unspecified Escherichia coli [E. coli] as the cause of diseases classified elsewhere: Secondary | ICD-10-CM

## 2021-09-24 DIAGNOSIS — Z3402 Encounter for supervision of normal first pregnancy, second trimester: Secondary | ICD-10-CM

## 2021-09-24 DIAGNOSIS — Z363 Encounter for antenatal screening for malformations: Secondary | ICD-10-CM | POA: Diagnosis not present

## 2021-09-24 DIAGNOSIS — F1290 Cannabis use, unspecified, uncomplicated: Secondary | ICD-10-CM

## 2021-09-24 DIAGNOSIS — N390 Urinary tract infection, site not specified: Secondary | ICD-10-CM

## 2021-09-24 DIAGNOSIS — Z3A20 20 weeks gestation of pregnancy: Secondary | ICD-10-CM

## 2021-09-24 DIAGNOSIS — R8271 Bacteriuria: Secondary | ICD-10-CM

## 2021-09-24 NOTE — Progress Notes (Signed)
[place] 20 wks,breech,cx 3.5 cm,posterior placenta gr 0,normal ovaries,SVP of fluid 3 cm,two LVEIC 1.8 & 1.4 mm,EFW 289 g 16.6%,FHR 140 bpm,anatomy complete

## 2021-09-24 NOTE — Progress Notes (Signed)
° °  LOW-RISK PREGNANCY VISIT Patient name: Megan Sandoval MRN [id number]  Date of birth: May 31, 2001 Chief Complaint:   Routine Prenatal Visit  History of Present Illness:   Megan Sandoval is a 20 y.o. G1P0 female at [redacted]w[redacted]d with an Estimated Date of Delivery: 02/11/22 being seen today for ongoing management of a low-risk pregnancy.   Depression screen PHQ 2/9 08/06/2021  Decreased Interest 1  Down, Depressed, Hopeless 1  PHQ - 2 Score 2  Altered sleeping 0  Tired, decreased energy 1  Change in appetite 3  Feeling bad or failure about yourself  0  Trouble concentrating 0  Moving slowly or fidgety/restless 0  Suicidal thoughts 0  PHQ-9 Score 6    Today she reports no complaints. Contractions: Not present. Vag. Bleeding: None.  Movement: Present. denies leaking of fluid. Review of Systems:   Pertinent items are noted in HPI Denies abnormal vaginal discharge w/ itching/odor/irritation, headaches, visual changes, shortness of breath, chest pain, abdominal pain, severe nausea/vomiting, or problems with urination or bowel movements unless otherwise stated above. Pertinent History Reviewed:  Reviewed past medical,surgical, social, obstetrical and family history.  Reviewed problem list, medications and allergies.  Physical Assessment:   Vitals:   09/24/21 1416  BP: 110/63  Pulse: 83  Weight: 122 lb (55.3 kg)  Body mass index is 20.94 kg/m.        Physical Examination:   General appearance: Well appearing, and in no distress  Mental status: Alert, oriented to person, place, and time  Skin: Warm & dry  Respiratory: Normal respiratory effort, no distress  Abdomen: Soft, gravid, nontender  Pelvic: Cervical exam deferred         Extremities: Edema: None  Psych:  mood and affect appropriate  Fetal Status:     Movement: Present    Anatomy scan- breech,cx 3.5 cm,posterior placenta gr 0,normal ovaries,SVP of fluid 3 cm,two LVEIC 1.8 & 1.4 mm,EFW 289 g 16.6%,FHR 140 bpm,anatomy  complete  Chaperone: n/a    No results found for this or any previous visit (from the past 24 hour(s)).   Assessment & Plan:  1) Low-risk pregnancy G1P0 at [redacted]w[redacted]d with an Estimated Date of Delivery: 02/11/22   -prior UTI []  POC today, currently Asx   Meds: No orders of the defined types were placed in this encounter.  Labs/procedures today: anatomy scan, urine culture  Plan:  Continue routine obstetrical care  Next visit: prefers in person    Reviewed: Preterm labor symptoms and general obstetric precautions including but not limited to vaginal bleeding, contractions, leaking of fluid and fetal movement were reviewed in detail with the patient.  All questions were answered.   Follow-up: Return in about 4 weeks (around 10/22/2021) for LROB visit.  Orders Placed This Encounter  Procedures   Urine Culture    10/24/2021, DO Attending Obstetrician & Gynecologist, [facility] for [facility], [facility]

## 2021-09-26 LAB — URINE CULTURE

## 2021-10-22 ENCOUNTER — Other Ambulatory Visit: Payer: Self-pay

## 2021-10-22 ENCOUNTER — Ambulatory Visit (INDEPENDENT_AMBULATORY_CARE_PROVIDER_SITE_OTHER): Payer: Medicaid Other | Admitting: Advanced Practice Midwife

## 2021-10-22 VITALS — BP 103/60 | HR 104 | Wt 130.0 lb

## 2021-10-22 DIAGNOSIS — Z3A24 24 weeks gestation of pregnancy: Secondary | ICD-10-CM

## 2021-10-22 DIAGNOSIS — R8271 Bacteriuria: Secondary | ICD-10-CM

## 2021-10-22 MED ORDER — CEPHALEXIN 500 MG PO CAPS: 500.0000 mg | ORAL_CAPSULE | Freq: Four times a day (QID) | ORAL | 0 refills | Status: DC

## 2021-10-22 NOTE — Patient Instructions (Signed)
Megan Sandoval, I greatly value your feedback.  If you receive a survey following your visit with [place] today, we appreciate you taking the time to fill it out.  Thanks, Philipp Deputy, CNM   You will have your sugar test next visit.  Please do not eat or drink anything after midnight the night before you come, not even water.  You will be here for at least two hours.  Please make an appointment online for the bloodwork at [email or web address] for 8:30am (or as close to this as possible). Make sure you select the [facility] service center. The day of the appointment, check in with our office first, then you will go to [facility] to start the sugar test.    [facility] HAS MOVED!!! It is now [facility] at [facility] ([address] [place], [place] [address]) Entrance C, located off of [address] valet parking  Go to [email or web address] to register for FREE online childbirth classes   Call the office [phone]) or go to [facility] if: You begin to have strong, frequent contractions Your water breaks.  Sometimes it is a big gush of fluid, sometimes it is just a trickle that keeps getting your panties wet or running down your legs You have vaginal bleeding.  It is normal to have a small amount of spotting if your cervix was checked.  You don't feel your baby moving like normal.  If you don't, get you something to eat and drink and lay down and focus on feeling your baby move.   If your baby is still not moving like normal, you should call the office or go to [facility].  [place] Pediatricians/Family Doctors: [facility] [phone]           [facility] [phone]                [facility] [phone] (usually not accepting new patients unless you have family there already, you are always welcome to call and ask)      [facility] [phone]       [facility] Pediatricians/Family Doctors:  [facility]: [phone] [facility]: [phone] [facility]: [phone]  [facility] Doctors:  [facility]: [phone]  [facility]: [phone]  [facility] Doctors: [facility]: [phone]   Home Blood Pressure Monitoring for Patients   Your provider has recommended that you check your blood pressure (BP) at least once a week at home. If you do not have a blood pressure cuff at home, one will be provided for you. Contact your provider if you have not received your monitor within 1 week.   Helpful Tips for Accurate Home Blood Pressure Checks  Don't smoke, exercise, or drink caffeine 30 minutes before checking your BP Use the restroom before checking your BP (a full bladder can raise your pressure) Relax in a comfortable upright chair Feet on the ground Left arm resting comfortably on a flat surface at the level of your heart Legs uncrossed Back supported Sit quietly and don't talk Place the cuff on your bare arm Adjust snuggly, so that only two fingertips can fit between your skin and the top of the cuff Check 2 readings separated by at least one minute Keep a log of your BP readings For a visual, please reference this diagram: http://ccnc.care/bpdiagram  Provider Name: [facility]     Phone: [phone]  Zone 1: ALL CLEAR  Continue to monitor your symptoms:  BP reading is less than 140 (top number) or less than 90 (bottom number)  No right upper stomach pain No headaches or seeing spots No feeling nauseated or throwing up No swelling in face and hands  Zone 2: CAUTION Call your doctor's office for any of the following:  BP reading is greater than 140 (top number) or greater than 90 (bottom number)  Stomach pain under your ribs in the middle or right side Headaches or seeing spots Feeling nauseated or throwing up Swelling in face and hands  Zone 3: EMERGENCY  Seek  immediate medical care if you have any of the following:  BP reading is greater than160 (top number) or greater than 110 (bottom number) Severe headaches not improving with Tylenol Serious difficulty catching your breath Any worsening symptoms from Zone 2   Second Trimester of Pregnancy The second trimester is from week 13 through week 28, months 4 through 6. The second trimester is often a time when you feel your best. Your body has also adjusted to being pregnant, and you begin to feel better physically. Usually, morning sickness has lessened or quit completely, you may have more energy, and you may have an increase in appetite. The second trimester is also a time when the fetus is growing rapidly. At the end of the sixth month, the fetus is about 9 inches long and weighs about 1 pounds. You will likely begin to feel the baby move (quickening) between 18 and 20 weeks of the pregnancy. BODY CHANGES Your body goes through many changes during pregnancy. The changes vary from woman to woman.  Your weight will continue to increase. You will notice your lower abdomen bulging out. You may begin to get stretch marks on your hips, abdomen, and breasts. You may develop headaches that can be relieved by medicines approved by your health care provider. You may urinate more often because the fetus is pressing on your bladder. You may develop or continue to have heartburn as a result of your pregnancy. You may develop constipation because certain hormones are causing the muscles that push waste through your intestines to slow down. You may develop hemorrhoids or swollen, bulging veins (varicose veins). You may have back pain because of the weight gain and pregnancy hormones relaxing your joints between the bones in your pelvis and as a result of a shift in weight and the muscles that support your balance. Your breasts will continue to grow and be tender. Your gums may bleed and may be sensitive to brushing  and flossing. Dark spots or blotches (chloasma, mask of pregnancy) may develop on your face. This will likely fade after the baby is born. A dark line from your belly button to the pubic area (linea nigra) may appear. This will likely fade after the baby is born. You may have changes in your hair. These can include thickening of your hair, rapid growth, and changes in texture. Some women also have hair loss during or after pregnancy, or hair that feels dry or thin. Your hair will most likely return to normal after your baby is born. WHAT TO EXPECT AT YOUR PRENATAL VISITS During a routine prenatal visit: You will be weighed to make sure you and the fetus are growing normally. Your blood pressure will be taken. Your abdomen will be measured to track your baby's growth. The fetal heartbeat will be listened to. Any test results from the previous visit will be discussed. Your health care provider  may ask you: How you are feeling. If you are feeling the baby move. If you have had any abnormal symptoms, such as leaking fluid, bleeding, severe headaches, or abdominal cramping. If you have any questions. Other tests that may be performed during your second trimester include: Blood tests that check for: Low iron levels (anemia). Gestational diabetes (between 24 and 28 weeks). Rh antibodies. Urine tests to check for infections, diabetes, or protein in the urine. An ultrasound to confirm the proper growth and development of the baby. An amniocentesis to check for possible genetic problems. Fetal screens for spina bifida and Down syndrome. HOME CARE INSTRUCTIONS  Avoid all smoking, herbs, alcohol, and unprescribed drugs. These chemicals affect the formation and growth of the baby. Follow your health care provider's instructions regarding medicine use. There are medicines that are either safe or unsafe to take during pregnancy. Exercise only as directed by your health care provider. Experiencing  uterine cramps is a good sign to stop exercising. Continue to eat regular, healthy meals. Wear a good support bra for breast tenderness. Do not use hot tubs, steam rooms, or saunas. Wear your seat belt at all times when driving. Avoid raw meat, uncooked cheese, cat litter boxes, and soil used by cats. These carry germs that can cause birth defects in the baby. Take your prenatal vitamins. Try taking a stool softener (if your health care provider approves) if you develop constipation. Eat more high-fiber foods, such as fresh vegetables or fruit and whole grains. Drink plenty of fluids to keep your urine clear or pale yellow. Take warm sitz baths to soothe any pain or discomfort caused by hemorrhoids. Use hemorrhoid cream if your health care provider approves. If you develop varicose veins, wear support hose. Elevate your feet for 15 minutes, 3-4 times a day. Limit salt in your diet. Avoid heavy lifting, wear low heel shoes, and practice good posture. Rest with your legs elevated if you have leg cramps or low back pain. Visit your dentist if you have not gone yet during your pregnancy. Use a soft toothbrush to brush your teeth and be gentle when you floss. A sexual relationship may be continued unless your health care provider directs you otherwise. Continue to go to all your prenatal visits as directed by your health care provider. SEEK MEDICAL CARE IF:  You have dizziness. You have mild pelvic cramps, pelvic pressure, or nagging pain in the abdominal area. You have persistent nausea, vomiting, or diarrhea. You have a bad smelling vaginal discharge. You have pain with urination. SEEK IMMEDIATE MEDICAL CARE IF:  You have a fever. You are leaking fluid from your vagina. You have spotting or bleeding from your vagina. You have severe abdominal cramping or pain. You have rapid weight gain or loss. You have shortness of breath with chest pain. You notice sudden or extreme swelling of your face,  hands, ankles, feet, or legs. You have not felt your baby move in over an hour. You have severe headaches that do not go away with medicine. You have vision changes. Document Released: 09/08/2001 Document Revised: 09/19/2013 Document Reviewed: 11/15/2012 [facility] Patient Information 2015 [place], [place]. This information is not intended to replace advice given to you by your health care provider. Make sure you discuss any questions you have with your health care provider.

## 2021-10-22 NOTE — Progress Notes (Signed)
° °  LOW-RISK PREGNANCY VISIT Patient name: Megan Sandoval MRN [id number]  Date of birth: 01-May-2001 Chief Complaint:   Routine Prenatal Visit  History of Present Illness:   Megan Sandoval is a 21 y.o. G1P0 female at [redacted]w[redacted]d with an Estimated Date of Delivery: 02/11/22 being seen today for ongoing management of a low-risk pregnancy.  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 Contractions: Not present. Vag. Bleeding: None.  Movement: Present. denies leaking of fluid. Review of Systems:   Pertinent items are noted in HPI Denies abnormal vaginal discharge w/ itching/odor/irritation, headaches, visual changes, shortness of breath, chest pain, abdominal pain, severe nausea/vomiting, or problems with urination or bowel movements unless otherwise stated above. Pertinent History Reviewed:  Reviewed past medical,surgical, social, obstetrical and family history.  Reviewed problem list, medications and allergies. Physical Assessment:   Vitals:   10/22/21 1329  BP: 103/60  Pulse: (!) 104  Weight: 130 lb (59 kg)  Body mass index is 22.31 kg/m.        Physical Examination:   General appearance: Well appearing, and in no distress  Mental status: Alert, oriented to person, place, and time  Skin: Warm & dry  Breast: L with fluctuant, warm, erythematous area 4cm wide, above nipple  Cardiovascular: Normal heart rate noted  Respiratory: Normal respiratory effort, no distress  Abdomen: Soft, gravid, nontender  Pelvic: Cervical exam deferred         Extremities: Edema: None  Fetal Status: Fetal Heart Rate (bpm): 158 Fundal Height: 24 cm Movement: Present    No results found for this or any previous visit (from the past 24 hour(s)).  Assessment & Plan:  1) Low-risk pregnancy G1P0 at [redacted]w[redacted]d with an Estimated Date of Delivery: 02/11/22   2) L breast cellulitis, apply heat, Tylenol for pain, rx Keflex   Meds:  Meds ordered this  encounter  Medications   cephALEXin (KEFLEX) 500 MG capsule    Sig: Take 1 capsule (500 mg total) by mouth 4 (four) times daily.    Dispense:  28 capsule    Refill:  0    Order Specific Question:   Supervising Provider    Answer:   Janyth Pupa [id number]   Labs/procedures today: none  Plan:  Continue routine obstetrical care   Reviewed: Preterm labor symptoms and general obstetric precautions including but not limited to vaginal bleeding, contractions, leaking of fluid and fetal movement were reviewed in detail with the patient.  All questions were answered. Didn't ask about home bp cuff. Check bp weekly, let us know if >140/90.   Follow-up: Return in about 3 weeks (around 11/12/2021) for LROB, PN2, in person.  No orders of the defined types were placed in this encounter.  Myrtis Ser CNM 10/22/2021 1:49 PM

## 2021-11-12 ENCOUNTER — Other Ambulatory Visit (HOSPITAL_COMMUNITY)
Admission: RE | Admit: 2021-11-12 | Discharge: 2021-11-12 | Disposition: A | Discharge status: Home / Self Care | Payer: Medicaid Other | Source: Ambulatory Visit | Attending: Advanced Practice Midwife | Admitting: Advanced Practice Midwife

## 2021-11-12 ENCOUNTER — Other Ambulatory Visit: Payer: Self-pay

## 2021-11-12 ENCOUNTER — Other Ambulatory Visit: Payer: Medicaid Other

## 2021-11-12 ENCOUNTER — Ambulatory Visit (INDEPENDENT_AMBULATORY_CARE_PROVIDER_SITE_OTHER): Payer: Medicaid Other | Admitting: Advanced Practice Midwife

## 2021-11-12 VITALS — BP 110/68 | HR 92 | Wt 134.0 lb

## 2021-11-12 DIAGNOSIS — Z3A27 27 weeks gestation of pregnancy: Secondary | ICD-10-CM | POA: Diagnosis not present

## 2021-11-12 DIAGNOSIS — Z23 Encounter for immunization: Secondary | ICD-10-CM

## 2021-11-12 DIAGNOSIS — N898 Other specified noninflammatory disorders of vagina: Secondary | ICD-10-CM

## 2021-11-12 DIAGNOSIS — R8271 Bacteriuria: Secondary | ICD-10-CM

## 2021-11-12 DIAGNOSIS — Z3402 Encounter for supervision of normal first pregnancy, second trimester: Secondary | ICD-10-CM | POA: Insufficient documentation

## 2021-11-12 DIAGNOSIS — Z131 Encounter for screening for diabetes mellitus: Secondary | ICD-10-CM

## 2021-11-12 LAB — POCT URINALYSIS DIPSTICK OB
Blood, UA: NEGATIVE
Glucose, UA: NEGATIVE
Ketones, UA: NEGATIVE
Leukocytes, UA: NEGATIVE
Nitrite, UA: NEGATIVE
POC,PROTEIN,UA: NEGATIVE

## 2021-11-12 MED ORDER — TERCONAZOLE 0.4 % VA CREA: 1.0000 | TOPICAL_CREAM | Freq: Every day | VAGINAL | 0 refills | Status: DC

## 2021-11-12 NOTE — Progress Notes (Addendum)
° °  LOW-RISK PREGNANCY VISIT Patient name: Megan Sandoval MRN [id number]  Date of birth: 2001/04/10 Chief Complaint:   Routine Prenatal Visit (PN2)  History of Present Illness:   Megan Sandoval is a 21 y.o. G1P0 female at [redacted]w[redacted]d with an Estimated Date of Delivery: 02/11/22 being seen today for ongoing management of a low-risk pregnancy.  Today she reports  vag d/c and irritation x 1wk . Contractions: Not present. Vag. Bleeding: None.  Movement: Present. denies leaking of fluid. Review of Systems:   Pertinent items are noted in HPI Denies abnormal vaginal discharge w/ itching/odor/irritation, headaches, visual changes, shortness of breath, chest pain, abdominal pain, severe nausea/vomiting, or problems with urination or bowel movements unless otherwise stated above. Pertinent History Reviewed:  Reviewed past medical,surgical, social, obstetrical and family history.  Reviewed problem list, medications and allergies. Physical Assessment:   Vitals:   11/12/21 0840  BP: 110/68  Pulse: 92  Weight: 134 lb (60.8 kg)  Body mass index is 23 kg/m.        Physical Examination:   General appearance: Well appearing, and in no distress  Mental status: Alert, oriented to person, place, and time  Skin: Warm & dry  Cardiovascular: Normal heart rate noted  Respiratory: Normal respiratory effort, no distress  Abdomen: Soft, gravid, nontender  Pelvic: Cervical exam deferred         Extremities: Edema: None  Fetal Status: Fetal Heart Rate (bpm): 150 Fundal Height: 26 cm Movement: Present    Results for orders placed or performed in visit on 11/12/21 (from the past 24 hour(s))  POC Urinalysis Dipstick OB   Collection Time: 11/12/21  9:13 AM  Result Value Ref Range   Color, UA     Clarity, UA     Glucose, UA Negative Negative   Bilirubin, UA     Ketones, UA neg    Spec Grav, UA     Blood, UA neg    pH, UA     POC,PROTEIN,UA Negative Negative, Trace, Small (1+), Moderate (2+), Large (3+), 4+    Urobilinogen, UA     Nitrite, UA neg    Leukocytes, UA Negative Negative   Appearance     Odor      Assessment & Plan:  1) Low-risk pregnancy G1P0 at [redacted]w[redacted]d with an Estimated Date of Delivery: 02/11/22   2) Probable VVC, rx Terazol 7; CV swab sent   Meds:  Meds ordered this encounter  Medications   terconazole (TERAZOL 7) 0.4 % vaginal cream    Sig: Place 1 applicator vaginally at bedtime.    Dispense:  45 g    Refill:  0    Order Specific Question:   Supervising Provider    Answer:   Duane Lope H [[id number]]   Labs/procedures today: CV swab, Tdap, UA, PN2  Plan:  Continue routine obstetrical care    Reviewed: Preterm labor symptoms and general obstetric precautions including but not limited to vaginal bleeding, contractions, leaking of fluid and fetal movement were reviewed in detail with the patient.  All questions were answered. Didn't ask about home bp cuff. Check bp weekly, let us know if >140/90.   Follow-up: Return in about 3 weeks (around 12/03/2021) for LROB, in person.  Orders Placed This Encounter  Procedures   Tdap vaccine greater than or equal to 7yo IM   POC Urinalysis Dipstick OB   Arabella Merles [facility] 11/12/2021 9:19 AM

## 2021-11-12 NOTE — Patient Instructions (Signed)
Lajoy, thank you for choosing our office today! We appreciate the opportunity to meet your healthcare needs. You may receive a short survey by mail, e-mail, or through MyChart. If you are happy with your care we would appreciate if you could take just a few minutes to complete the survey questions. We read all of your comments and take your feedback very seriously. Thank you again for choosing our office.  [facility]  [facility] ([address] [place], [place] [address]) Entrance C, located off of [address] Free 24/7 valet parking   CLASSES: Go to [email or web address] to register for classes (childbirth, breastfeeding, waterbirth, infant CPR, daddy bootcamp, etc.)  Call the office ([phone]) or go to [facility] if: You begin to have strong, frequent contractions Your water breaks.  Sometimes it is a big gush of fluid, sometimes it is just a trickle that keeps getting your panties wet or running down your legs You have vaginal bleeding.  It is normal to have a small amount of spotting if your cervix was checked.  You don't feel your baby moving like normal.  If you don't, get you something to eat and drink and lay down and focus on feeling your baby move.   If your baby is still not moving like normal, you should call the office or go to [facility].  Call the office ([phone]) or go to [facility] for these signs of pre-eclampsia: Severe headache that does not go away with Tylenol Visual changes- seeing spots, double, blurred vision Pain under your right breast or upper abdomen that does not go away with Tums or heartburn medicine Nausea and/or vomiting Severe swelling in your hands, feet, and face   Tdap Vaccine It is recommended that you get the Tdap vaccine during the third trimester of EACH pregnancy to help protect your baby from getting pertussis (whooping cough) 27-36 weeks is the BEST time to do  this so that you can pass the protection on to your baby. During pregnancy is better than after pregnancy, but if you are unable to get it during pregnancy it will be offered at the hospital.  You can get this vaccine with us, at the health department, your family doctor, or some local pharmacies Everyone who will be around your baby should also be up-to-date on their vaccines before the baby comes. Adults (who are not pregnant) only need 1 dose of Tdap during adulthood.   [place] Pediatricians/Family Doctors [facility] ([facility]): [address], [phone]           [facility]: [address], [phone]                [facility] ([facility]): [address], [phone] (call to ask if accepting patients) [facility]: [address], [place], [phone]    [place] Pediatricians/Family Doctors [facility] ([facility]): [address], [phone] [facility]: [address], [phone] [facility]: [address], [phone]  [place] [facility]  [facility] ([facility]): [phone] [facility]: [address], [phone]   [place] [facility]: [address], [phone]  [place] [facility]  [facility]: [address], [phone]  Home Blood Pressure Monitoring for Patients   Your provider has recommended that you check your   blood pressure (BP) at least once a week at home. If you do not have a blood pressure cuff at home, one will be provided for you. Contact your provider if you have not received your monitor within 1 week.   Helpful Tips for Accurate Home Blood Pressure Checks  Don't smoke, exercise, or drink caffeine 30 minutes before checking your BP Use the restroom before checking your BP (a full bladder can raise your  pressure) Relax in a comfortable upright chair Feet on the ground Left arm resting comfortably on a flat surface at the level of your heart Legs uncrossed Back supported Sit quietly and don't talk Place the cuff on your bare arm Adjust snuggly, so that only two fingertips can fit between your skin and the top of the cuff Check 2 readings separated by at least one minute Keep a log of your BP readings For a visual, please reference this diagram: http://ccnc.care/bpdiagram  Provider Name: [facility]     Phone: [phone]  Zone 1: ALL CLEAR  Continue to monitor your symptoms:  BP reading is less than 140 (top number) or less than 90 (bottom number)  No right upper stomach pain No headaches or seeing spots No feeling nauseated or throwing up No swelling in face and hands  Zone 2: CAUTION Call your doctor's office for any of the following:  BP reading is greater than 140 (top number) or greater than 90 (bottom number)  Stomach pain under your ribs in the middle or right side Headaches or seeing spots Feeling nauseated or throwing up Swelling in face and hands  Zone 3: EMERGENCY  Seek immediate medical care if you have any of the following:  BP reading is greater than160 (top number) or greater than 110 (bottom number) Severe headaches not improving with Tylenol Serious difficulty catching your breath Any worsening symptoms from Zone 2   Third Trimester of Pregnancy The third trimester is from week 29 through week 42, months 7 through 9. The third trimester is a time when the fetus is growing rapidly. At the end of the ninth month, the fetus is about 20 inches in length and weighs 6-10 pounds.  BODY CHANGES Your body goes through many changes during pregnancy. The changes vary from woman to woman.  Your weight will continue to increase. You can expect to gain 25-35 pounds (11-16 kg) by the end of the pregnancy. You may begin to get stretch marks on your hips, abdomen,  and breasts. You may urinate more often because the fetus is moving lower into your pelvis and pressing on your bladder. You may develop or continue to have heartburn as a result of your pregnancy. You may develop constipation because certain hormones are causing the muscles that push waste through your intestines to slow down. You may develop hemorrhoids or swollen, bulging veins (varicose veins). You may have pelvic pain because of the weight gain and pregnancy hormones relaxing your joints between the bones in your pelvis. Backaches may result from overexertion of the muscles supporting your posture. You may have changes in your hair. These can include thickening of your hair, rapid growth, and changes in texture. Some women also have hair loss during or after pregnancy, or hair that feels dry or thin. Your hair will most likely return to normal after your baby is born. Your breasts will continue to grow and be tender. A yellow discharge may leak from your breasts called colostrum. Your belly button may stick out. You may   feel short of breath because of your expanding uterus. You may notice the fetus "dropping," or moving lower in your abdomen. You may have a bloody mucus discharge. This usually occurs a few days to a week before labor begins. Your cervix becomes thin and soft (effaced) near your due date. WHAT TO EXPECT AT YOUR PRENATAL EXAMS  You will have prenatal exams every 2 weeks until week 36. Then, you will have weekly prenatal exams. During a routine prenatal visit: You will be weighed to make sure you and the fetus are growing normally. Your blood pressure is taken. Your abdomen will be measured to track your baby's growth. The fetal heartbeat will be listened to. Any test results from the previous visit will be discussed. You may have a cervical check near your due date to see if you have effaced. At around 36 weeks, your caregiver will check your cervix. At the same time, your  caregiver will also perform a test on the secretions of the vaginal tissue. This test is to determine if a type of bacteria, Group B streptococcus, is present. Your caregiver will explain this further. Your caregiver may ask you: What your birth plan is. How you are feeling. If you are feeling the baby move. If you have had any abnormal symptoms, such as leaking fluid, bleeding, severe headaches, or abdominal cramping. If you have any questions. Other tests or screenings that may be performed during your third trimester include: Blood tests that check for low iron levels (anemia). Fetal testing to check the health, activity level, and growth of the fetus. Testing is done if you have certain medical conditions or if there are problems during the pregnancy. FALSE LABOR You may feel small, irregular contractions that eventually go away. These are called Braxton Hicks contractions, or false labor. Contractions may last for hours, days, or even weeks before true labor sets in. If contractions come at regular intervals, intensify, or become painful, it is best to be seen by your caregiver.  SIGNS OF LABOR  Menstrual-like cramps. Contractions that are 5 minutes apart or less. Contractions that start on the top of the uterus and spread down to the lower abdomen and back. A sense of increased pelvic pressure or back pain. A watery or bloody mucus discharge that comes from the vagina. If you have any of these signs before the 37th week of pregnancy, call your caregiver right away. You need to go to the hospital to get checked immediately. HOME CARE INSTRUCTIONS  Avoid all smoking, herbs, alcohol, and unprescribed drugs. These chemicals affect the formation and growth of the baby. Follow your caregiver's instructions regarding medicine use. There are medicines that are either safe or unsafe to take during pregnancy. Exercise only as directed by your caregiver. Experiencing uterine cramps is a good sign to  stop exercising. Continue to eat regular, healthy meals. Wear a good support bra for breast tenderness. Do not use hot tubs, steam rooms, or saunas. Wear your seat belt at all times when driving. Avoid raw meat, uncooked cheese, cat litter boxes, and soil used by cats. These carry germs that can cause birth defects in the baby. Take your prenatal vitamins. Try taking a stool softener (if your caregiver approves) if you develop constipation. Eat more high-fiber foods, such as fresh vegetables or fruit and whole grains. Drink plenty of fluids to keep your urine clear or pale yellow. Take warm sitz baths to soothe any pain or discomfort caused by hemorrhoids. Use hemorrhoid cream if  your caregiver approves. If you develop varicose veins, wear support hose. Elevate your feet for 15 minutes, 3-4 times a day. Limit salt in your diet. Avoid heavy lifting, wear low heal shoes, and practice good posture. Rest a lot with your legs elevated if you have leg cramps or low back pain. Visit your dentist if you have not gone during your pregnancy. Use a soft toothbrush to brush your teeth and be gentle when you floss. A sexual relationship may be continued unless your caregiver directs you otherwise. Do not travel far distances unless it is absolutely necessary and only with the approval of your caregiver. Take prenatal classes to understand, practice, and ask questions about the labor and delivery. Make a trial run to the hospital. Pack your hospital bag. Prepare the baby's nursery. Continue to go to all your prenatal visits as directed by your caregiver. SEEK MEDICAL CARE IF: You are unsure if you are in labor or if your water has broken. You have dizziness. You have mild pelvic cramps, pelvic pressure, or nagging pain in your abdominal area. You have persistent nausea, vomiting, or diarrhea. You have a bad smelling vaginal discharge. You have pain with urination. SEEK IMMEDIATE MEDICAL CARE IF:  You  have a fever. You are leaking fluid from your vagina. You have spotting or bleeding from your vagina. You have severe abdominal cramping or pain. You have rapid weight loss or gain. You have shortness of breath with chest pain. You notice sudden or extreme swelling of your face, hands, ankles, feet, or legs. You have not felt your baby move in over an hour. You have severe headaches that do not go away with medicine. You have vision changes. Document Released: 09/08/2001 Document Revised: 09/19/2013 Document Reviewed: 11/15/2012 [facility] Patient Information 2015 [place], [place]. This information is not intended to replace advice given to you by your health care provider. Make sure you discuss any questions you have with your health care provider.

## 2021-11-13 ENCOUNTER — Other Ambulatory Visit: Payer: Self-pay | Admitting: Women's Health

## 2021-11-13 LAB — CERVICOVAGINAL ANCILLARY ONLY
Bacterial Vaginitis (gardnerella): NEGATIVE
Candida Glabrata: NEGATIVE
Candida Vaginitis: POSITIVE — AB
Chlamydia: NEGATIVE
Comment: NEGATIVE
Comment: NEGATIVE
Comment: NEGATIVE
Comment: NEGATIVE
Comment: NEGATIVE
Comment: NORMAL
Neisseria Gonorrhea: NEGATIVE
Trichomonas: NEGATIVE

## 2021-11-13 LAB — RPR: RPR Ser Ql: NONREACTIVE

## 2021-11-13 LAB — ANTIBODY SCREEN: Antibody Screen: NEGATIVE

## 2021-11-13 LAB — HIV ANTIBODY (ROUTINE TESTING W REFLEX): HIV Screen 4th Generation wRfx: NONREACTIVE

## 2021-11-13 LAB — CBC
Hematocrit: 30.2 % — ABNORMAL LOW (ref 34.0–46.6)
Hemoglobin: 10 g/dL — ABNORMAL LOW (ref 11.1–15.9)
MCH: 29.3 pg (ref 26.6–33.0)
MCHC: 33.1 g/dL (ref 31.5–35.7)
MCV: 89 fL (ref 79–97)
Platelets: 221 10*3/uL (ref 150–450)
RBC: 3.41 x10E6/uL — ABNORMAL LOW (ref 3.77–5.28)
RDW: 11.6 % — ABNORMAL LOW (ref 11.7–15.4)
WBC: 9.1 10*3/uL (ref 3.4–10.8)

## 2021-11-13 LAB — GLUCOSE TOLERANCE, 2 HOURS W/ 1HR
Glucose, 1 hour: 74 mg/dL (ref 70–179)
Glucose, 2 hour: 76 mg/dL (ref 70–152)
Glucose, Fasting: 71 mg/dL (ref 70–91)

## 2021-11-13 MED ORDER — FERROUS SULFATE 325 (65 FE) MG PO TABS: 325.0000 mg | ORAL_TABLET | ORAL | 2 refills | Status: DC

## 2021-11-14 ENCOUNTER — Telehealth: Payer: Self-pay | Admitting: *Deleted

## 2021-11-14 NOTE — Telephone Encounter (Signed)
Patient informed she is anemic, prescription send to her pharmacy, make sure to take pnv daily, increase fe-rich foods: red meats, green leafy vegs, beans, etc. Pt verbalized understanding with no further questions. New link sent for mychart signup.

## 2021-12-03 ENCOUNTER — Other Ambulatory Visit: Payer: Self-pay

## 2021-12-03 ENCOUNTER — Ambulatory Visit (INDEPENDENT_AMBULATORY_CARE_PROVIDER_SITE_OTHER): Payer: Medicaid Other | Admitting: Advanced Practice Midwife

## 2021-12-03 ENCOUNTER — Encounter: Payer: Self-pay | Admitting: Advanced Practice Midwife

## 2021-12-03 VITALS — BP 111/62 | HR 96 | Wt 135.0 lb

## 2021-12-03 DIAGNOSIS — Z3A30 30 weeks gestation of pregnancy: Secondary | ICD-10-CM

## 2021-12-03 DIAGNOSIS — Z23 Encounter for immunization: Secondary | ICD-10-CM | POA: Diagnosis not present

## 2021-12-03 DIAGNOSIS — Z3403 Encounter for supervision of normal first pregnancy, third trimester: Secondary | ICD-10-CM

## 2021-12-03 NOTE — Progress Notes (Signed)
? ?  LOW-RISK PREGNANCY VISIT ?Patient name: Megan Sandoval MRN [id number]  Date of birth: December 20, 2000 ?Chief Complaint:   ?Routine Prenatal Visit ? ?History of Present Illness:   ?Megan Sandoval is a 21 y.o. G1P0 female at [redacted]w[redacted]d with an Estimated Date of Delivery: 02/11/22 being seen today for ongoing management of a low-risk pregnancy.  ?Today she reports no complaints. Contractions: Not present. Vag. Bleeding: None.  Movement: Present. denies leaking of fluid. ?Review of Systems:   ?Pertinent items are noted in HPI ?Denies abnormal vaginal discharge w/ itching/odor/irritation, headaches, visual changes, shortness of breath, chest pain, abdominal pain, severe nausea/vomiting, or problems with urination or bowel movements unless otherwise stated above. ?Pertinent History Reviewed:  ?Reviewed past medical,surgical, social, obstetrical and family history.  ?Reviewed problem list, medications and allergies. ?Physical Assessment:  ? ?Vitals:  ? 12/03/21 1017  ?BP: 111/62  ?Pulse: 96  ?Weight: 135 lb (61.2 kg)  ?Body mass index is 23.17 kg/m?. ?  ?     Physical Examination:  ? General appearance: Well appearing, and in no distress ? Mental status: Alert, oriented to person, place, and time ? Skin: Warm & dry ? Cardiovascular: Normal heart rate noted ? Respiratory: Normal respiratory effort, no distress ? Abdomen: Soft, gravid, nontender ? Pelvic: Cervical exam deferred        ? Extremities: Edema: None ? ?Fetal Status: Fetal Heart Rate (bpm): 140 Fundal Height: 30 cm Movement: Present   ? ?No results found for this or any previous visit (from the past 24 hour(s)).  ?Assessment & Plan:  ?1) Low-risk pregnancy G1P0 at [redacted]w[redacted]d with an Estimated Date of Delivery: 02/11/22  ? ?  ?Meds: No orders of the defined types were placed in this encounter. ? ?Labs/procedures today: flu vaccine ? ?Plan:  Continue routine obstetrical care  ? ?Reviewed: Preterm labor symptoms and general obstetric precautions including but not limited to  vaginal bleeding, contractions, leaking of fluid and fetal movement were reviewed in detail with the patient.  All questions were answered. Didn't ask about home bp cuff.  Check bp weekly, let us know if >140/90.  ? ?Follow-up: Return in about 2 weeks (around 12/17/2021) for LROB, in person; try for FCD. ? ?Orders Placed This Encounter  ?Procedures  ? Flu Vaccine QUAD 17mo+IM (Fluarix, Fluzone & Alfiuria Quad PF)  ? ?Arabella Merles CNM ?12/03/2021 ?10:40 AM  ?

## 2021-12-03 NOTE — Patient Instructions (Signed)
Megan Sandoval, thank you for choosing our office today! We appreciate the opportunity to meet your healthcare needs. You may receive a short survey by mail, e-mail, or through MyChart. If you are happy with your care we would appreciate if you could take just a few minutes to complete the survey questions. We read all of your comments and take your feedback very seriously. Thank you again for choosing our office.  [facility] [place] ([address] [place], [place] [address]) Entrance C, located off of [address] Free 24/7 valet parking   CLASSES: Go to [email or web address] to register for classes (childbirth, breastfeeding, waterbirth, infant CPR, daddy bootcamp, etc.)  Call the office ([phone]) or go to [facility] if: You begin to have strong, frequent contractions Your water breaks.  Sometimes it is a big gush of fluid, sometimes it is just a trickle that keeps getting your panties wet or running down your legs You have vaginal bleeding.  It is normal to have a small amount of spotting if your cervix was checked.  You don't feel your baby moving like normal.  If you don't, get you something to eat and drink and lay down and focus on feeling your baby move.   If your baby is still not moving like normal, you should call the office or go to [facility].  Call the office ([phone]) or go to [facility] for these signs of pre-eclampsia: Severe headache that does not go away with Tylenol Visual changes- seeing spots, double, blurred vision Pain under your right breast or upper abdomen that does not go away with Tums or heartburn medicine Nausea and/or vomiting Severe swelling in your hands, feet, and face   Tdap Vaccine It is recommended that you get the Tdap vaccine during the third trimester of EACH pregnancy to help protect your baby from getting pertussis (whooping cough) 27-36 weeks is the BEST time to do  this so that you can pass the protection on to your baby. During pregnancy is better than after pregnancy, but if you are unable to get it during pregnancy it will be offered at the hospital.  You can get this vaccine with us, at the health department, your family doctor, or some local pharmacies Everyone who will be around your baby should also be up-to-date on their vaccines before the baby comes. Adults (who are not pregnant) only need 1 dose of Tdap during adulthood.   [place] Pediatricians/Family Doctors [facility] ([facility]): [address], [phone]           [facility]: [address], [phone]                [facility] ([facility]): [address], [phone] (call to ask if accepting patients) [facility]: [address], [place], [phone]    [place] Pediatricians/Family Doctors [facility] ([facility]): [address], [phone] [facility]: [address], [phone] [facility]: [address], [phone]  [place] [facility]  [facility] ([facility]): [phone] [facility]: [address], [phone]   [place] Family Doctors [facility]: [address], [phone]  [place] [facility]  [facility]: [address], [phone]  Home Blood Pressure Monitoring for Patients   Your provider has recommended that you check your   blood pressure (BP) at least once a week at home. If you do not have a blood pressure cuff at home, one will be provided for you. Contact your provider if you have not received your monitor within 1 week.   Helpful Tips for Accurate Home Blood Pressure Checks  Don't smoke, exercise, or drink caffeine 30 minutes before checking your BP Use the restroom before checking your BP (a full bladder can raise your  pressure) Relax in a comfortable upright chair Feet on the ground Left arm resting comfortably on a flat surface at the level of your heart Legs uncrossed Back supported Sit quietly and don't talk Place the cuff on your bare arm Adjust snuggly, so that only two fingertips can fit between your skin and the top of the cuff Check 2 readings separated by at least one minute Keep a log of your BP readings For a visual, please reference this diagram: http://ccnc.care/bpdiagram  Provider Name: [facility]     Phone: [phone]  Zone 1: ALL CLEAR  Continue to monitor your symptoms:  BP reading is less than 140 (top number) or less than 90 (bottom number)  No right upper stomach pain No headaches or seeing spots No feeling nauseated or throwing up No swelling in face and hands  Zone 2: CAUTION Call your doctor's office for any of the following:  BP reading is greater than 140 (top number) or greater than 90 (bottom number)  Stomach pain under your ribs in the middle or right side Headaches or seeing spots Feeling nauseated or throwing up Swelling in face and hands  Zone 3: EMERGENCY  Seek immediate medical care if you have any of the following:  BP reading is greater than160 (top number) or greater than 110 (bottom number) Severe headaches not improving with Tylenol Serious difficulty catching your breath Any worsening symptoms from Zone 2   Third Trimester of Pregnancy The third trimester is from week 29 through week 42, months 7 through 9. The third trimester is a time when the fetus is growing rapidly. At the end of the ninth month, the fetus is about 20 inches in length and weighs 6-10 pounds.  BODY CHANGES Your body goes through many changes during pregnancy. The changes vary from woman to woman.  Your weight will continue to increase. You can expect to gain 25-35 pounds (11-16 kg) by the end of the pregnancy. You may begin to get stretch marks on your hips, abdomen,  and breasts. You may urinate more often because the fetus is moving lower into your pelvis and pressing on your bladder. You may develop or continue to have heartburn as a result of your pregnancy. You may develop constipation because certain hormones are causing the muscles that push waste through your intestines to slow down. You may develop hemorrhoids or swollen, bulging veins (varicose veins). You may have pelvic pain because of the weight gain and pregnancy hormones relaxing your joints between the bones in your pelvis. Backaches may result from overexertion of the muscles supporting your posture. You may have changes in your hair. These can include thickening of your hair, rapid growth, and changes in texture. Some women also have hair loss during or after pregnancy, or hair that feels dry or thin. Your hair will most likely return to normal after your baby is born. Your breasts will continue to grow and be tender. A yellow discharge may leak from your breasts called colostrum. Your belly button may stick out. You may   feel short of breath because of your expanding uterus. You may notice the fetus "dropping," or moving lower in your abdomen. You may have a bloody mucus discharge. This usually occurs a few days to a week before labor begins. Your cervix becomes thin and soft (effaced) near your due date. WHAT TO EXPECT AT YOUR PRENATAL EXAMS  You will have prenatal exams every 2 weeks until week 36. Then, you will have weekly prenatal exams. During a routine prenatal visit: You will be weighed to make sure you and the fetus are growing normally. Your blood pressure is taken. Your abdomen will be measured to track your baby's growth. The fetal heartbeat will be listened to. Any test results from the previous visit will be discussed. You may have a cervical check near your due date to see if you have effaced. At around 36 weeks, your caregiver will check your cervix. At the same time, your  caregiver will also perform a test on the secretions of the vaginal tissue. This test is to determine if a type of bacteria, Group B streptococcus, is present. Your caregiver will explain this further. Your caregiver may ask you: What your birth plan is. How you are feeling. If you are feeling the baby move. If you have had any abnormal symptoms, such as leaking fluid, bleeding, severe headaches, or abdominal cramping. If you have any questions. Other tests or screenings that may be performed during your third trimester include: Blood tests that check for low iron levels (anemia). Fetal testing to check the health, activity level, and growth of the fetus. Testing is done if you have certain medical conditions or if there are problems during the pregnancy. FALSE LABOR You may feel small, irregular contractions that eventually go away. These are called Braxton Hicks contractions, or false labor. Contractions may last for hours, days, or even weeks before true labor sets in. If contractions come at regular intervals, intensify, or become painful, it is best to be seen by your caregiver.  SIGNS OF LABOR  Menstrual-like cramps. Contractions that are 5 minutes apart or less. Contractions that start on the top of the uterus and spread down to the lower abdomen and back. A sense of increased pelvic pressure or back pain. A watery or bloody mucus discharge that comes from the vagina. If you have any of these signs before the 37th week of pregnancy, call your caregiver right away. You need to go to the hospital to get checked immediately. HOME CARE INSTRUCTIONS  Avoid all smoking, herbs, alcohol, and unprescribed drugs. These chemicals affect the formation and growth of the baby. Follow your caregiver's instructions regarding medicine use. There are medicines that are either safe or unsafe to take during pregnancy. Exercise only as directed by your caregiver. Experiencing uterine cramps is a good sign to  stop exercising. Continue to eat regular, healthy meals. Wear a good support bra for breast tenderness. Do not use hot tubs, steam rooms, or saunas. Wear your seat belt at all times when driving. Avoid raw meat, uncooked cheese, cat litter boxes, and soil used by cats. These carry germs that can cause birth defects in the baby. Take your prenatal vitamins. Try taking a stool softener (if your caregiver approves) if you develop constipation. Eat more high-fiber foods, such as fresh vegetables or fruit and whole grains. Drink plenty of fluids to keep your urine clear or pale yellow. Take warm sitz baths to soothe any pain or discomfort caused by hemorrhoids. Use hemorrhoid cream if  your caregiver approves. If you develop varicose veins, wear support hose. Elevate your feet for 15 minutes, 3-4 times a day. Limit salt in your diet. Avoid heavy lifting, wear low heal shoes, and practice good posture. Rest a lot with your legs elevated if you have leg cramps or low back pain. Visit your dentist if you have not gone during your pregnancy. Use a soft toothbrush to brush your teeth and be gentle when you floss. A sexual relationship may be continued unless your caregiver directs you otherwise. Do not travel far distances unless it is absolutely necessary and only with the approval of your caregiver. Take prenatal classes to understand, practice, and ask questions about the labor and delivery. Make a trial run to the hospital. Pack your hospital bag. Prepare the baby's nursery. Continue to go to all your prenatal visits as directed by your caregiver. SEEK MEDICAL CARE IF: You are unsure if you are in labor or if your water has broken. You have dizziness. You have mild pelvic cramps, pelvic pressure, or nagging pain in your abdominal area. You have persistent nausea, vomiting, or diarrhea. You have a bad smelling vaginal discharge. You have pain with urination. SEEK IMMEDIATE MEDICAL CARE IF:  You  have a fever. You are leaking fluid from your vagina. You have spotting or bleeding from your vagina. You have severe abdominal cramping or pain. You have rapid weight loss or gain. You have shortness of breath with chest pain. You notice sudden or extreme swelling of your face, hands, ankles, feet, or legs. You have not felt your baby move in over an hour. You have severe headaches that do not go away with medicine. You have vision changes. Document Released: 09/08/2001 Document Revised: 09/19/2013 Document Reviewed: 11/15/2012 [facility] Patient Information 2015 [place], [place]. This information is not intended to replace advice given to you by your health care provider. Make sure you discuss any questions you have with your health care provider.

## 2021-12-18 ENCOUNTER — Other Ambulatory Visit: Payer: Self-pay

## 2021-12-18 ENCOUNTER — Ambulatory Visit (INDEPENDENT_AMBULATORY_CARE_PROVIDER_SITE_OTHER): Payer: Medicaid Other | Admitting: Advanced Practice Midwife

## 2021-12-18 ENCOUNTER — Encounter: Payer: Self-pay | Admitting: Advanced Practice Midwife

## 2021-12-18 VITALS — BP 120/66 | HR 119 | Wt 139.0 lb

## 2021-12-18 DIAGNOSIS — Z3A32 32 weeks gestation of pregnancy: Secondary | ICD-10-CM

## 2021-12-18 DIAGNOSIS — Z3403 Encounter for supervision of normal first pregnancy, third trimester: Secondary | ICD-10-CM

## 2021-12-18 NOTE — Patient Instructions (Signed)
Megan Sandoval, I greatly value your feedback.  If you receive a survey following your visit with [place] today, we appreciate you taking the time to fill it out.  ?Thanks, ?Megan Berthold, DNP, CNM ? ?[place]!!! ?It is now [facility] [place] [facility] ?([address] [place], [place] [address]) ?Entrance located off of [address] ?Free 24/7 valet parking  ? ?Go to [email or web address] to register for FREE online childbirth classes  ?  ?Call the office [phone]) or go to [place] if: ?You begin to have strong, frequent contractions ?Your water breaks.  Sometimes it is a big gush of fluid, sometimes it is just a trickle that keeps getting your panties wet or running down your legs ?You have vaginal bleeding.  It is normal to have a small amount of spotting if your cervix was checked.  ?You don't feel your baby moving like normal.  If you don't, get you something to eat and drink and lay down and focus on feeling your baby move.  You should feel at least 10 movements in 2 hours.  If you don't, you should call the office or go to [facility].  ? ?Home Blood Pressure Monitoring for Patients  ? ?Your provider has recommended that you check your blood pressure (BP) at least once a week at home. If you do not have a blood pressure cuff at home, one will be provided for you. Contact your provider if you have not received your monitor within 1 week.  ? ?Helpful Tips for Accurate Home Blood Pressure Checks  ?Don't smoke, exercise, or drink caffeine 30 minutes before checking your BP ?Use the restroom before checking your BP (a full bladder can raise your pressure) ?Relax in a comfortable upright chair ?Feet on the ground ?Left arm resting comfortably on a flat surface at the level of your heart ?Legs uncrossed ?Back supported ?Sit quietly and don't talk ?Place the cuff on your bare arm ?Adjust snuggly, so that only two fingertips can fit between your skin and the top  of the cuff ?Check 2 readings separated by at least one minute ?Keep a log of your BP readings ?For a visual, please reference this diagram: http://ccnc.care/bpdiagram ? ?Provider Name: [facility]     Phone: [phone] ? ?Zone 1: ALL CLEAR  ?Continue to monitor your symptoms:  ?BP reading is less than 140 (top number) or less than 90 (bottom number)  ?No right upper stomach pain ?No headaches or seeing spots ?No feeling nauseated or throwing up ?No swelling in face and hands ? ?Zone 2: CAUTION ?Call your doctor's office for any of the following:  ?BP reading is greater than 140 (top number) or greater than 90 (bottom number)  ?Stomach pain under your ribs in the middle or right side ?Headaches or seeing spots ?Feeling nauseated or throwing up ?Swelling in face and hands ? ?Zone 3: EMERGENCY  ?Seek immediate medical care if you have any of the following:  ?BP reading is greater than160 (top number) or greater than 110 (bottom number) ?Severe headaches not improving with Tylenol ?Serious difficulty catching your breath ?Any worsening symptoms from Zone 2  ? ?

## 2021-12-18 NOTE — Progress Notes (Signed)
? ?  LOW-RISK PREGNANCY VISIT ?Patient name: KRISS KNAB MRN [id number]  Date of birth: [birth year] ?Chief Complaint:   ?Routine Prenatal Visit (Wants to make sure yeast has cleared up) ? ?History of Present Illness:   ?JYZELLE DIMA is a 21 y.o. G1P0 female at [redacted]w[redacted]d with an Estimated Date of Delivery: 02/11/22 being seen today for ongoing management of a low-risk pregnancy.  ?Today she reports feels like yeast infection is gone, but only had 4-5 days of meds. Contractions: Not present. Vag. Bleeding: None.  Movement: Present. denies leaking of fluid. ?Review of Systems:   ?Pertinent items are noted in HPI ?Denies abnormal vaginal discharge w/ itching/odor/irritation, headaches, visual changes, shortness of breath, chest pain, abdominal pain, severe nausea/vomiting, or problems with urination or bowel movements unless otherwise stated above. ?Pertinent History Reviewed:  ?Reviewed past medical,surgical, social, obstetrical and family history.  ?Reviewed problem list, medications and allergies. ?Physical Assessment:  ? ?Vitals:  ? 12/18/21 1354  ?BP: 120/66  ?Pulse: (!) 119  ?Weight: 139 lb (63 kg)  ?Body mass index is 23.86 kg/m?. ?  ?     Physical Examination:  ? General appearance: Well appearing, and in no distress ? Mental status: Alert, oriented to person, place, and time ? Skin: Warm & dry ? Cardiovascular: Normal heart rate noted ? Respiratory: Normal respiratory effort, no distress ? Abdomen: Soft, gravid, nontender ? Pelvic: Cervical exam deferred  Pelvic:  normal appearing discharge      ? Extremities: Edema: None ? ?Fetal Status: Fetal Heart Rate (bpm): 145 Fundal Height: 32 cm Movement: Present   ? ?Chaperone: n/a   ? ?No results found for this or any previous visit (from the past 24 hour(s)).  ?Assessment & Plan:  ?1) Low-risk pregnancy G1P0 at [redacted]w[redacted]d with an Estimated Date of Delivery: 02/11/22  ? ?  ?Meds: No orders of the defined types were placed in this encounter. ? ?Labs/procedures today:   ? ?Plan:  Continue routine obstetrical care  ?Next visit: prefers in person   ? ?Reviewed: Preterm labor symptoms and general obstetric precautions including but not limited to vaginal bleeding, contractions, leaking of fluid and fetal movement were reviewed in detail with the patient.  All questions were answered. Has home bp cuff.. Check bp weekly, let us know if >140/90.  ? ?Follow-up: Return in about 2 weeks (around 01/01/2022) for [place]. ? ?No orders of the defined types were placed in this encounter. ? ?Christin Fudge DNP, CNM ?12/18/2021 ?2:10 PM ? ?

## 2021-12-29 ENCOUNTER — Telehealth: Payer: Self-pay | Admitting: Women's Health

## 2021-12-29 MED ORDER — TERCONAZOLE 0.4 % VA CREA: 1.0000 | TOPICAL_CREAM | Freq: Every day | VAGINAL | 0 refills | Status: DC

## 2021-12-29 NOTE — Addendum Note (Signed)
Addended by: Shawna Clamp R on: 12/29/2021 12:13 PM ? ? Modules accepted: Orders ? ?

## 2021-12-29 NOTE — Telephone Encounter (Signed)
Patient ran out of medicine for uti and was told if she needed another prescription to let us know. Please advise.  ?

## 2021-12-29 NOTE — Telephone Encounter (Signed)
Called pt to inform her of Kim's msg regarding testing if her symptoms didn't improve. Pt confirmed understanding. ?

## 2021-12-29 NOTE — Telephone Encounter (Signed)
Returned pt's call, two identifiers used. Pt explained that she was having the same discharge that she had previously. It is white and mucous-like, with itching. She had been prescribed Terconazole and wanted to see if she could get a refill. Told pt her concern would be forwarded to a provider. ?

## 2021-12-30 ENCOUNTER — Encounter: Payer: Self-pay | Admitting: Women's Health

## 2021-12-30 ENCOUNTER — Ambulatory Visit (INDEPENDENT_AMBULATORY_CARE_PROVIDER_SITE_OTHER): Payer: Medicaid Other | Admitting: Women's Health

## 2021-12-30 VITALS — BP 120/71 | HR 94 | Wt 138.4 lb

## 2021-12-30 DIAGNOSIS — Z3403 Encounter for supervision of normal first pregnancy, third trimester: Secondary | ICD-10-CM

## 2021-12-30 NOTE — Patient Instructions (Signed)
[place], thank you for choosing our office today! We appreciate the opportunity to meet your healthcare needs. You may receive a short survey by mail, e-mail, or through [employer]. If you are happy with your care we would appreciate if you could take just a few minutes to complete the survey questions. We read all of your comments and take your feedback very seriously. Thank you again for choosing our office.  ?Center for [employer] Team at [facility] ? ?[facility] at [facility] ?([address] [place], [place] [address]) ?Entrance C, located off of [address] ?Free 24/7 valet parking  ? ?CLASSES: Go to [email or web address] to register for classes (childbirth, breastfeeding, waterbirth, infant CPR, daddy bootcamp, etc.) ? ?Call the office [phone]) or go to [facility] if: ?You begin to have strong, frequent contractions ?Your water breaks.  Sometimes it is a big gush of fluid, sometimes it is just a trickle that keeps getting your panties wet or running down your legs ?You have vaginal bleeding.  It is normal to have a small amount of spotting if your cervix was checked.  ?You don't feel your baby moving like normal.  If you don't, get you something to eat and drink and lay down and focus on feeling your baby move.   If your baby is still not moving like normal, you should call the office or go to [facility]. ? ?Call the office [phone]) or go to [facility] hospital for these signs of pre-eclampsia: ?Severe headache that does not go away with Tylenol ?Visual changes- seeing spots, double, blurred vision ?Pain under your right breast or upper abdomen that does not go away with Tums or heartburn medicine ?Nausea and/or vomiting ?Severe swelling in your hands, feet, and face  ? ?Tdap Vaccine ?It is recommended that you get the Tdap vaccine during the third trimester of EACH pregnancy to help protect your baby from getting pertussis (whooping cough) ?27-36 weeks is the BEST time to do  this so that you can pass the protection on to your baby. During pregnancy is better than after pregnancy, but if you are unable to get it during pregnancy it will be offered at the hospital.  ?You can get this vaccine with [place], at the health department, your family doctor, or some local pharmacies ?Everyone who will be around your baby should also be up-to-date on their vaccines before the baby comes. Adults (who are not pregnant) only need 1 dose of Tdap during adulthood.  ? ?[place] Pediatricians/Family Doctors ?[facility]): [address], [phone]           ?[facility]: [address], [phone]                ?[facility]): [address], [phone] (call to ask if accepting patients) ?[facility]: [address], [place], [phone]   ? ?[place] Pediatricians/Family Doctors ?[facility]): [address], [phone] ?[facility]: [address] [place], [phone] ?[facility]: [address], [phone] ? ?[employer] Family Doctors  ?[facility]): [phone] ?[facility]: [address], [phone]  ? ?[facility] Family Doctors ?[facility]: [address], [phone] ? ?[facility]  ?[facility]: [phone], [phone] ? ?Home Blood Pressure Monitoring for Patients  ? ?Your provider has recommended that you check your  blood pressure (BP) at least once a week at home. If you do not have a blood pressure cuff at home, one will be provided for you. Contact your provider if you have not received your monitor within 1 week.  ? ?Helpful Tips for Accurate Home Blood Pressure Checks  ?Don't smoke, exercise, or drink caffeine 30 minutes before checking your BP ?Use the restroom before checking your BP (a full bladder can raise your  pressure) ?Relax in a comfortable upright chair ?Feet on the ground ?Left arm resting comfortably on a flat surface at the level of your heart ?Legs uncrossed ?Back supported ?Sit quietly and don't talk ?Place the cuff on your bare arm ?Adjust snuggly, so that only two fingertips can fit between your skin and the top of the cuff ?Check 2 readings separated by at least one minute ?Keep a log of your BP readings ?For a visual, please reference this diagram: http://ccnc.care/bpdiagram ? ?Provider Name: [facility]     Phone: [phone] ? ?Zone 1: ALL CLEAR  ?Continue to monitor your symptoms:  ?BP reading is less than 140 (top number) or less than 90 (bottom number)  ?No right upper stomach pain ?No headaches or seeing spots ?No feeling nauseated or throwing up ?No swelling in face and hands ? ?Zone 2: CAUTION ?Call your doctor's office for any of the following:  ?BP reading is greater than 140 (top number) or greater than 90 (bottom number)  ?Stomach pain under your ribs in the middle or right side ?Headaches or seeing spots ?Feeling nauseated or throwing up ?Swelling in face and hands ? ?Zone 3: EMERGENCY  ?Seek immediate medical care if you have any of the following:  ?BP reading is greater than160 (top number) or greater than 110 (bottom number) ?Severe headaches not improving with Tylenol ?Serious difficulty catching your breath ?Any worsening symptoms from Zone 2  ?Preterm Labor and Birth Information ? ?The normal length of a pregnancy is 39-41 weeks. Preterm labor is when labor starts before 37 completed weeks of pregnancy. ?What are the risk factors for preterm labor? ?Preterm labor is more likely to occur in women who: ?Have certain infections during pregnancy such as a bladder infection, sexually transmitted infection, or infection inside the uterus (chorioamnionitis). ?Have a shorter-than-normal cervix. ?Have gone into preterm labor before. ?Have had surgery on their cervix. ?Are younger than age 72  or older than age 108. ?Are African American. ?Are pregnant with twins or multiple babies (multiple gestation). ?Take street drugs or smoke while pregnant. ?Do not gain enough weight while pregnant. ?Became pregnant shortly after having been pregnant. ?What are the symptoms of preterm labor? ?Symptoms of preterm labor include: ?Cramps similar to those that can happen during a menstrual period. The cramps may happen with diarrhea. ?Pain in the abdomen or lower back. ?Regular uterine contractions that may feel like tightening of the abdomen. ?A feeling of increased pressure in the pelvis. ?Increased watery or bloody mucus discharge from the vagina. ?Water breaking (ruptured amniotic sac). ?Why is it important to recognize signs of preterm labor? ?It is important to recognize signs of preterm labor because babies who are born prematurely may not be fully developed. This can put them at an increased risk for: ?Long-term (chronic) heart and lung problems. ?Difficulty immediately after birth with regulating body systems, including blood sugar, body temperature, heart rate, and breathing rate. ?Bleeding in the brain. ?Cerebral palsy. ?Learning difficulties. ?Death. ?These risks are highest for babies who are born before 11 weeks  of pregnancy. ?How is preterm labor treated? ?Treatment depends on the length of your pregnancy, your condition, and the health of your baby. It may involve: ?Having a stitch (suture) placed in your cervix to prevent your cervix from opening too early (cerclage). ?Taking or being given medicines, such as: ?Hormone medicines. These may be given early in pregnancy to help support the pregnancy. ?Medicine to stop contractions. ?Medicines to help mature the baby?s lungs. These may be prescribed if the risk of delivery is high. ?Medicines to prevent your baby from developing cerebral palsy. ?If the labor happens before 34 weeks of pregnancy, you may need to stay in the hospital. ?What should I do if I  think I am in preterm labor? ?If you think that you are going into preterm labor, call your health care provider right away. ?How can I prevent preterm labor in future pregnancies? ?To increase your chance

## 2021-12-30 NOTE — Progress Notes (Signed)
? ? ?  LOW-RISK PREGNANCY VISIT ?Patient name: Megan Sandoval MRN [id number]  Date of birth: 2000/10/17 ?Chief Complaint:   ?Routine Prenatal Visit ? ?History of Present Illness:   ?Megan Sandoval is a 21 y.o. G1P0 female at [redacted]w[redacted]d with an Estimated Date of Delivery: 02/11/22 being seen today for ongoing management of a low-risk pregnancy.  ? ?Today she reports no complaints. Contractions: Not present. Vag. Bleeding: None.  Movement: Present. denies leaking of fluid. ? ? ?  11/12/2021  ?  8:45 AM 08/06/2021  ?  2:28 PM  ?Depression screen PHQ 2/9  ?Decreased Interest 1 1  ?Down, Depressed, Hopeless 3 1  ?PHQ - 2 Score 4 2  ?Altered sleeping 0 0  ?Tired, decreased energy 1 1  ?Change in appetite 0 3  ?Feeling bad or failure about yourself  3 0  ?Trouble concentrating 0 0  ?Moving slowly or fidgety/restless 0 0  ?Suicidal thoughts 0 0  ?PHQ-9 Score 8 6  ? ?  ? ?  11/12/2021  ?  8:45 AM 08/06/2021  ?  2:29 PM  ?GAD 7 : Generalized Anxiety Score  ?Nervous, Anxious, on Edge 1 1  ?Control/stop worrying 3 1  ?Worry too much - different things 3 1  ?Trouble relaxing 1 1  ?Restless 0 1  ?Easily annoyed or irritable 3 1  ?Afraid - awful might happen 0 0  ?Total GAD 7 Score 11 6  ? ? ?  ?Review of Systems:   ?Pertinent items are noted in HPI ?Denies abnormal vaginal discharge w/ itching/odor/irritation, headaches, visual changes, shortness of breath, chest pain, abdominal pain, severe nausea/vomiting, or problems with urination or bowel movements unless otherwise stated above. ?Pertinent History Reviewed:  ?Reviewed past medical,surgical, social, obstetrical and family history.  ?Reviewed problem list, medications and allergies. ?Physical Assessment:  ? ?Vitals:  ? 12/30/21 0918  ?BP: 120/71  ?Pulse: 94  ?Weight: 138 lb 6.4 oz (62.8 kg)  ?Body mass index is 23.76 kg/m?. ?  ?     Physical Examination:  ? General appearance: Well appearing, and in no distress ? Mental status: Alert, oriented to person, place, and time ? Skin: Warm &  dry ? Cardiovascular: Normal heart rate noted ? Respiratory: Normal respiratory effort, no distress ? Abdomen: Soft, gravid, nontender ? Pelvic: Cervical exam deferred        ? Extremities: Edema: None ? ?Fetal Status: Fetal Heart Rate (bpm): 138 Fundal Height: 31 cm Movement: Present   ? ?Chaperone: N/A   ?No results found for this or any previous visit (from the past 24 hour(s)).  ?Assessment & Plan:  ?1) Low-risk pregnancy G1P0 at [redacted]w[redacted]d with an Estimated Date of Delivery: 02/11/22  ?  ?Meds: No orders of the defined types were placed in this encounter. ? ?Labs/procedures today: none ? ?Plan:  Continue routine obstetrical care  ?Next visit: prefers in person   ? ?Reviewed: Preterm labor symptoms and general obstetric precautions including but not limited to vaginal bleeding, contractions, leaking of fluid and fetal movement were reviewed in detail with the patient.  All questions were answered. Does have home bp cuff. Office bp cuff given: not applicable. Check bp weekly, let us know if consistently >140 and/or >90. ? ?Follow-up: Return in about 2 weeks (around 01/13/2022) for [place], CNM, in person then weekly. ? ?No future appointments. ? ?No orders of the defined types were placed in this encounter. ? ?Roma Schanz CNM, WHNP-BC ?12/30/2021 ?9:49 AM  ?

## 2022-01-13 ENCOUNTER — Ambulatory Visit (INDEPENDENT_AMBULATORY_CARE_PROVIDER_SITE_OTHER): Payer: Medicaid Other | Admitting: Women's Health

## 2022-01-13 ENCOUNTER — Encounter: Payer: Self-pay | Admitting: Women's Health

## 2022-01-13 VITALS — BP 115/68 | HR 94 | Wt 137.0 lb

## 2022-01-13 DIAGNOSIS — Z3403 Encounter for supervision of normal first pregnancy, third trimester: Secondary | ICD-10-CM

## 2022-01-13 NOTE — Progress Notes (Signed)
? ? ?LOW-RISK PREGNANCY VISIT ?Patient name: Megan Sandoval MRN [id number]  Date of birth: 08-Dec-2000 ?Chief Complaint:   ?Routine Prenatal Visit ? ?History of Present Illness:   ?Megan Sandoval is a 21 y.o. G1P0 female at [redacted]w[redacted]d with an Estimated Date of Delivery: 02/11/22 being seen today for ongoing management of a low-risk pregnancy.  ? ?Today she reports  some occ contractions . Contractions: Not present. Vag. Bleeding: None.  Movement: Present. denies leaking of fluid. ? ? ?  11/12/2021  ?  8:45 AM 08/06/2021  ?  2:28 PM  ?Depression screen PHQ 2/9  ?Decreased Interest 1 1  ?Down, Depressed, Hopeless 3 1  ?PHQ - 2 Score 4 2  ?Altered sleeping 0 0  ?Tired, decreased energy 1 1  ?Change in appetite 0 3  ?Feeling bad or failure about yourself  3 0  ?Trouble concentrating 0 0  ?Moving slowly or fidgety/restless 0 0  ?Suicidal thoughts 0 0  ?PHQ-9 Score 8 6  ? ?  ? ?  11/12/2021  ?  8:45 AM 08/06/2021  ?  2:29 PM  ?GAD 7 : Generalized Anxiety Score  ?Nervous, Anxious, on Edge 1 1  ?Control/stop worrying 3 1  ?Worry too much - different things 3 1  ?Trouble relaxing 1 1  ?Restless 0 1  ?Easily annoyed or irritable 3 1  ?Afraid - awful might happen 0 0  ?Total GAD 7 Score 11 6  ? ? ?  ?Review of Systems:   ?Pertinent items are noted in HPI ?Denies abnormal vaginal discharge w/ itching/odor/irritation, headaches, visual changes, shortness of breath, chest pain, abdominal pain, severe nausea/vomiting, or problems with urination or bowel movements unless otherwise stated above. ?Pertinent History Reviewed:  ?Reviewed past medical,surgical, social, obstetrical and family history.  ?Reviewed problem list, medications and allergies. ?Physical Assessment:  ? ?Vitals:  ? 01/13/22 0956  ?BP: 115/68  ?Pulse: 94  ?Weight: 137 lb (62.1 kg)  ?Body mass index is 23.52 kg/m?. ?  ?     Physical Examination:  ? General appearance: Well appearing, and in no distress ? Mental status: Alert, oriented to person, place, and time ? Skin: Warm  & dry ? Cardiovascular: Normal heart rate noted ? Respiratory: Normal respiratory effort, no distress ? Abdomen: Soft, gravid, nontender ? Pelvic: Cervical exam deferred        ? Extremities: Edema: None ? ?Fetal Status: Fetal Heart Rate (bpm): 159 Fundal Height: 34 cm Movement: Present   ? ?Chaperone: N/A   ?No results found for this or any previous visit (from the past 24 hour(s)).  ?Assessment & Plan:  ?1) Low-risk pregnancy G1P0 at [redacted]w[redacted]d with an Estimated Date of Delivery: 02/11/22  ? ?Meds: No orders of the defined types were placed in this encounter. ? ?Labs/procedures today: none ? ?Plan:  Continue routine obstetrical care  ?Next visit: prefers will be in person for cultures    ? ?Reviewed: Preterm labor symptoms and general obstetric precautions including but not limited to vaginal bleeding, contractions, leaking of fluid and fetal movement were reviewed in detail with the patient.  All questions were answered. Does have home bp cuff. Office bp cuff given: not applicable. Check bp weekly, let us know if consistently >140 and/or >90. ? ?Follow-up: Return for weekly, As scheduled. ? ?Future Appointments  ?Date Time Provider [place]  ?01/20/2022 10:30 AM Roma Schanz, CNM [facility]  ?01/27/2022  9:50 AM Roma Schanz, CNM [facility]  ?02/03/2022  9:30 AM Gertie Exon,  Royetta Crochet, CNM [facility]  ?02/10/2022  9:10 AM Roma Schanz, CNM [facility]  ? ? ?No orders of the defined types were placed in this encounter. ? ?Roma Schanz CNM, WHNP-BC ?01/13/2022 ?10:10 AM  ?

## 2022-01-13 NOTE — Patient Instructions (Signed)
[place], thank you for choosing our office today! We appreciate the opportunity to meet your healthcare needs. You may receive a short survey by mail, e-mail, or through [employer]. If you are happy with your care we would appreciate if you could take just a few minutes to complete the survey questions. We read all of your comments and take your feedback very seriously. Thank you again for choosing our office.  ?Center for [facility] Team at [facility] ? ?[facility] [place] ?([address] [place], [place] [address]) ?Entrance C, located off of [address] ?Free 24/7 valet parking  ? ?CLASSES: Go to [email or web address] to register for classes (childbirth, breastfeeding, waterbirth, infant CPR, daddy bootcamp, etc.) ? ?Call the office ([phone]) or go to [facility] if: ?You begin to have strong, frequent contractions ?Your water breaks.  Sometimes it is a big gush of fluid, sometimes it is just a trickle that keeps getting your panties wet or running down your legs ?You have vaginal bleeding.  It is normal to have a small amount of spotting if your cervix was checked.  ?You don't feel your baby moving like normal.  If you don't, get you something to eat and drink and lay down and focus on feeling your baby move.   If your baby is still not moving like normal, you should call the office or go to [facility]. ? ?Call the office ([phone]) or [facility] for these signs of pre-eclampsia: ?Severe headache that does not go away with Tylenol ?Visual changes- seeing spots, double, blurred vision ?Pain under your right breast or upper abdomen that does not go away with Tums or heartburn medicine ?Nausea and/or vomiting ?Severe swelling in your hands, feet, and face  ? ?Tdap Vaccine ?It is recommended that you get the Tdap vaccine during the third trimester of EACH pregnancy to help protect your baby from getting pertussis (whooping cough) ?27-36 weeks is the BEST time to do  this so that you can pass the protection on to your baby. During pregnancy is better than after pregnancy, but if you are unable to get it during pregnancy it will be offered at the hospital.  ?You can get this vaccine with us, at the health department, your family doctor, or some local pharmacies ?Everyone who will be around your baby should also be up-to-date on their vaccines before the baby comes. Adults (who are not pregnant) only need 1 dose of Tdap during adulthood.  ? ?[place] Pediatricians/Family Doctors ?[facility] ([facility]): [address], [phone]           ?[facility]: [address], [phone]                ?[facility] ([facility]): [address], [phone] (call to ask if accepting patients) ?[facility]: [address], [place], [phone]   ? ?[place] Pediatricians/Family Doctors ?[facility] ([facility]): [address], [phone] ?[facility]: [address], [phone] ?[facility]: [address], [phone] ? ?[place] Family Doctors  ?[facility] ([facility]): [phone] ?[facility]: [address], [phone]  ? ?[place] Family Doctors ?[facility]: [address], [phone] ? ?[place] Family Doctors  ?[facility]: [address], [phone] ? ?Home Blood Pressure Monitoring for Patients  ? ?Your provider has recommended that you check your   blood pressure (BP) at least once a week at home. If you do not have a blood pressure cuff at home, one will be provided for you. Contact your provider if you have not received your monitor within 1 week.  ? ?Helpful Tips for Accurate Home Blood Pressure Checks  ?Don't smoke, exercise, or drink caffeine 30 minutes before checking your BP ?Use the restroom before checking your BP (a full bladder can raise your  pressure) ?Relax in a comfortable upright chair ?Feet on the ground ?Left arm resting comfortably on a flat surface at the level of your heart ?Legs uncrossed ?Back supported ?Sit quietly and don't talk ?Place the cuff on your bare arm ?Adjust snuggly, so that only two fingertips can fit between your skin and the top of the cuff ?Check 2 readings separated by at least one minute ?Keep a log of your BP readings ?For a visual, please reference this diagram: http://ccnc.care/bpdiagram ? ?Provider Name: [facility]     Phone: [phone] ? ?Zone 1: ALL CLEAR  ?Continue to monitor your symptoms:  ?BP reading is less than 140 (top number) or less than 90 (bottom number)  ?No right upper stomach pain ?No headaches or seeing spots ?No feeling nauseated or throwing up ?No swelling in face and hands ? ?Zone 2: CAUTION ?Call your doctor's office for any of the following:  ?BP reading is greater than 140 (top number) or greater than 90 (bottom number)  ?Stomach pain under your ribs in the middle or right side ?Headaches or seeing spots ?Feeling nauseated or throwing up ?Swelling in face and hands ? ?Zone 3: EMERGENCY  ?Seek immediate medical care if you have any of the following:  ?BP reading is greater than160 (top number) or greater than 110 (bottom number) ?Severe headaches not improving with Tylenol ?Serious difficulty catching your breath ?Any worsening symptoms from Zone 2  ?Preterm Labor and Birth Information ? ?The normal length of a pregnancy is 39-41 weeks. Preterm labor is when labor starts before 37 completed weeks of pregnancy. ?What are the risk factors for preterm labor? ?Preterm labor is more likely to occur in women who: ?Have certain infections during pregnancy such as a bladder infection, sexually transmitted infection, or infection inside the uterus (chorioamnionitis). ?Have a shorter-than-normal cervix. ?Have gone into preterm labor before. ?Have had surgery on their cervix. ?Are younger than age 72  or older than age 108. ?Are African American. ?Are pregnant with twins or multiple babies (multiple gestation). ?Take street drugs or smoke while pregnant. ?Do not gain enough weight while pregnant. ?Became pregnant shortly after having been pregnant. ?What are the symptoms of preterm labor? ?Symptoms of preterm labor include: ?Cramps similar to those that can happen during a menstrual period. The cramps may happen with diarrhea. ?Pain in the abdomen or lower back. ?Regular uterine contractions that may feel like tightening of the abdomen. ?A feeling of increased pressure in the pelvis. ?Increased watery or bloody mucus discharge from the vagina. ?Water breaking (ruptured amniotic sac). ?Why is it important to recognize signs of preterm labor? ?It is important to recognize signs of preterm labor because babies who are born prematurely may not be fully developed. This can put them at an increased risk for: ?Long-term (chronic) heart and lung problems. ?Difficulty immediately after birth with regulating body systems, including blood sugar, body temperature, heart rate, and breathing rate. ?Bleeding in the brain. ?Cerebral palsy. ?Learning difficulties. ?Death. ?These risks are highest for babies who are born before 11 weeks  of pregnancy. ?How is preterm labor treated? ?Treatment depends on the length of your pregnancy, your condition, and the health of your baby. It may involve: ?Having a stitch (suture) placed in your cervix to prevent your cervix from opening too early (cerclage). ?Taking or being given medicines, such as: ?Hormone medicines. These may be given early in pregnancy to help support the pregnancy. ?Medicine to stop contractions. ?Medicines to help mature the baby?s lungs. These may be prescribed if the risk of delivery is high. ?Medicines to prevent your baby from developing cerebral palsy. ?If the labor happens before 34 weeks of pregnancy, you may need to stay in the hospital. ?What should I do if I  think I am in preterm labor? ?If you think that you are going into preterm labor, call your health care provider right away. ?How can I prevent preterm labor in future pregnancies? ?To increase your chance

## 2022-01-20 ENCOUNTER — Ambulatory Visit (INDEPENDENT_AMBULATORY_CARE_PROVIDER_SITE_OTHER): Payer: Medicaid Other | Admitting: Obstetrics & Gynecology

## 2022-01-20 ENCOUNTER — Other Ambulatory Visit (HOSPITAL_COMMUNITY)
Admission: RE | Admit: 2022-01-20 | Discharge: 2022-01-20 | Disposition: A | Discharge status: Home / Self Care | Payer: Medicaid Other | Source: Ambulatory Visit | Attending: Obstetrics & Gynecology | Admitting: Obstetrics & Gynecology

## 2022-01-20 ENCOUNTER — Encounter: Payer: Self-pay | Admitting: Obstetrics & Gynecology

## 2022-01-20 ENCOUNTER — Encounter: Payer: Medicaid Other | Admitting: Women's Health

## 2022-01-20 VITALS — BP 122/70 | HR 100 | Wt 142.0 lb

## 2022-01-20 DIAGNOSIS — Z3493 Encounter for supervision of normal pregnancy, unspecified, third trimester: Secondary | ICD-10-CM

## 2022-01-20 DIAGNOSIS — Z3A36 36 weeks gestation of pregnancy: Secondary | ICD-10-CM

## 2022-01-20 NOTE — Progress Notes (Signed)
? ?  LOW-RISK PREGNANCY VISIT ?Patient name: Megan Sandoval MRN [id number]  Date of birth: June 18, 2001 ?Chief Complaint:   ?Routine Prenatal Visit ? ?History of Present Illness:   ?Megan Sandoval is a 21 y.o. G1P0 female at [redacted]w[redacted]d with an Estimated Date of Delivery: 02/11/22 being seen today for ongoing management of a low-risk pregnancy.  ? ?  11/12/2021  ?  8:45 AM 08/06/2021  ?  2:28 PM  ?Depression screen PHQ 2/9  ?Decreased Interest 1 1  ?Down, Depressed, Hopeless 3 1  ?PHQ - 2 Score 4 2  ?Altered sleeping 0 0  ?Tired, decreased energy 1 1  ?Change in appetite 0 3  ?Feeling bad or failure about yourself  3 0  ?Trouble concentrating 0 0  ?Moving slowly or fidgety/restless 0 0  ?Suicidal thoughts 0 0  ?PHQ-9 Score 8 6  ? ? ?Today she reports no complaints. Contractions: Not present. Vag. Bleeding: None.  Movement: Present. denies leaking of fluid. ?Review of Systems:   ?Pertinent items are noted in HPI ?Denies abnormal vaginal discharge w/ itching/odor/irritation, headaches, visual changes, shortness of breath, chest pain, abdominal pain, severe nausea/vomiting, or problems with urination or bowel movements unless otherwise stated above. ?Pertinent History Reviewed:  ?Reviewed past medical,surgical, social, obstetrical and family history.  ?Reviewed problem list, medications and allergies. ?Physical Assessment:  ? ?Vitals:  ? 01/20/22 1056  ?BP: 122/70  ?Pulse: 100  ?Weight: 142 lb (64.4 kg)  ?Body mass index is 24.37 kg/m?. ?  ?     Physical Examination:  ? General appearance: Well appearing, and in no distress ? Mental status: Alert, oriented to person, place, and time ? Skin: Warm & dry ? Cardiovascular: Normal heart rate noted ? Respiratory: Normal respiratory effort, no distress ? Abdomen: Soft, gravid, nontender ? Pelvic: Cervical exam performed  Dilation: Closed Effacement (%): Thick Station: -3 ? Extremities: Edema: None ? ?Fetal Status: Fetal Heart Rate (bpm): 157 Fundal Height: 36 cm Movement: Present  Presentation: Vertex ? ?Chaperone: Amanda Rash   ? ?No results found for this or any previous visit (from the past 24 hour(s)).  ?Assessment & Plan:  ?1) Low-risk pregnancy G1P0 at [redacted]w[redacted]d with an Estimated Date of Delivery: 02/11/22  ? ? ?  ?Meds: No orders of the defined types were placed in this encounter. ? ?Labs/procedures today: cultures ? ?Plan:  Continue routine obstetrical care  ?Next visit: prefers in person   ? ?Reviewed: Term labor symptoms and general obstetric precautions including but not limited to vaginal bleeding, contractions, leaking of fluid and fetal movement were reviewed in detail with the patient.  All questions were answered. Has home bp cuff. Rx faxed to . Check bp weekly, let us know if >140/90.  ? ?Follow-up: Return for keep scheduled. ? ?Orders Placed This Encounter  ?Procedures  ? Culture, beta strep (group b only)  ? ? ?Lazaro Arms, MD ?01/20/2022 ?11:36 AM ? ?

## 2022-01-21 LAB — CERVICOVAGINAL ANCILLARY ONLY
Chlamydia: NEGATIVE
Comment: NEGATIVE
Comment: NORMAL
Neisseria Gonorrhea: NEGATIVE

## 2022-01-23 LAB — CULTURE, BETA STREP (GROUP B ONLY): Strep Gp B Culture: POSITIVE — AB

## 2022-01-27 ENCOUNTER — Encounter: Payer: Self-pay | Admitting: Women's Health

## 2022-01-27 ENCOUNTER — Ambulatory Visit (INDEPENDENT_AMBULATORY_CARE_PROVIDER_SITE_OTHER): Payer: Medicaid Other | Admitting: Women's Health

## 2022-01-27 VITALS — BP 114/69 | HR 87 | Wt 138.0 lb

## 2022-01-27 DIAGNOSIS — Z3403 Encounter for supervision of normal first pregnancy, third trimester: Secondary | ICD-10-CM

## 2022-01-27 NOTE — Patient Instructions (Signed)
An, thank you for choosing our office today! We appreciate the opportunity to meet your healthcare needs. You may receive a short survey by mail, e-mail, or through MyChart. If you are happy with your care we would appreciate if you could take just a few minutes to complete the survey questions. We read all of your comments and take your feedback very seriously. Thank you again for choosing our office.  ?[facility] ? ?[facility] ?([address] [place], [place] [address]) ?Entrance C, located off of [address] ?Free 24/7 valet parking  ? ?CLASSES: Go to [email or web address] to register for classes (childbirth, breastfeeding, waterbirth, infant CPR, daddy bootcamp, etc.) ? ?Call the office ([phone]) or go to [facility] if: ?You begin to have strong, frequent contractions ?Your water breaks.  Sometimes it is a big gush of fluid, sometimes it is just a trickle that keeps getting your panties wet or running down your legs ?You have vaginal bleeding.  It is normal to have a small amount of spotting if your cervix was checked.  ?You don't feel your baby moving like normal.  If you don't, get you something to eat and drink and lay down and focus on feeling your baby move.   If your baby is still not moving like normal, you should call the office or go to [facility]. ? ?Call the office ([phone]) or go to [facility] for these signs of pre-eclampsia: ?Severe headache that does not go away with Tylenol ?Visual changes- seeing spots, double, blurred vision ?Pain under your right breast or upper abdomen that does not go away with Tums or heartburn medicine ?Nausea and/or vomiting ?Severe swelling in your hands, feet, and face  ? ?[place] Pediatricians/Family Doctors ?[facility] ([facility]): [address], [phone]           ?[facility]: [address], [phone]                 ?[facility] ([facility]): [address], [phone] (call to ask if accepting patients) ?[facility]: [address], [place], [phone]   ? ?[place] Pediatricians/Family Doctors ?[facility] ([facility]): [address], [phone] ?[facility]: [address], [phone] ?[facility]: [address], [phone] ? ?[place] [facility]  ?[facility] ([facility]): [phone] ?[facility]: [address], [phone]  ? ?[place] Family Doctors ?[facility]: [address], [phone] ? ?[place] [facility]  ?[facility]: [address], [phone] ? ?Home Blood Pressure Monitoring for Patients  ? ?Your provider has recommended that you check your blood pressure (BP) at least once a week at home. If you do not have a blood pressure cuff at home, one will be provided for you. Contact your provider if you have not received your monitor within 1 week.  ? ?Helpful Tips for Accurate Home Blood Pressure Checks  ?Don't smoke, exercise, or drink caffeine 30 minutes before checking your BP ?Use the restroom before checking your BP (a full bladder can raise your pressure) ?Relax in a comfortable upright chair ?Feet on the ground ?Left arm resting comfortably on a flat surface at the level of your heart ?Legs uncrossed ?Back supported ?Sit quietly and don't talk ?Place the cuff on your bare arm ?Adjust snuggly, so that only two fingertips   can fit between your skin and the top of the cuff ?Check 2 readings separated by at least one minute ?Keep a log of your BP readings ?For a visual, please reference this diagram: http://ccnc.care/bpdiagram ? ?Provider Name: [facility]     Phone: [phone] ? ?Zone 1: ALL CLEAR  ?Continue to monitor your symptoms:  ?BP reading is less than 140 (top number) or less than 90 (bottom number)  ?No right  upper stomach pain ?No headaches or seeing spots ?No feeling nauseated or throwing up ?No swelling in face and hands ? ?Zone 2: CAUTION ?Call your doctor's office for any of the following:  ?BP reading is greater than 140 (top number) or greater than 90 (bottom number)  ?Stomach pain under your ribs in the middle or right side ?Headaches or seeing spots ?Feeling nauseated or throwing up ?Swelling in face and hands ? ?Zone 3: EMERGENCY  ?Seek immediate medical care if you have any of the following:  ?BP reading is greater than160 (top number) or greater than 110 (bottom number) ?Severe headaches not improving with Tylenol ?Serious difficulty catching your breath ?Any worsening symptoms from Zone 2  ? ?Braxton Hicks Contractions ?Contractions of the uterus can occur throughout pregnancy, but they are not always a sign that you are in labor. You may have practice contractions called Braxton Hicks contractions. These false labor contractions are sometimes confused with true labor. ?What are [place] contractions? ?Braxton Hicks contractions are tightening movements that occur in the muscles of the uterus before labor. Unlike true labor contractions, these contractions do not result in opening (dilation) and thinning of the cervix. Toward the end of pregnancy (32-34 weeks), Braxton Hicks contractions can happen more often and may become stronger. These contractions are sometimes difficult to tell apart from true labor because they can be very uncomfortable. You should not feel embarrassed if you go to the hospital with false labor. ?Sometimes, the only way to tell if you are in true labor is for your health care provider to look for changes in the cervix. The health care provider will do a physical exam and may monitor your contractions. If you are not in true labor, the exam should show that your cervix is not dilating and your water has not broken. ?If there are no other health problems associated with your  pregnancy, it is completely safe for you to be sent home with false labor. You may continue to have Braxton Hicks contractions until you go into true labor. ?How to tell the difference between true labor and false labor ?True labor ?Contractions last 30-70 seconds. ?Contractions become very regular. ?Discomfort is usually felt in the top of the uterus, and it spreads to the lower abdomen and low back. ?Contractions do not go away with walking. ?Contractions usually become more intense and increase in frequency. ?The cervix dilates and gets thinner. ?False labor ?Contractions are usually shorter and not as strong as true labor contractions. ?Contractions are usually irregular. ?Contractions are often felt in the front of the lower abdomen and in the groin. ?Contractions may go away when you walk around or change positions while lying down. ?Contractions get weaker and are shorter-lasting as time goes on. ?The cervix usually does not dilate or become thin. ?Follow these instructions at home: ? ?Take over-the-counter and prescription medicines only as told by your health care provider. ?Keep up with your usual exercises and follow other instructions from your health care provider. ?Eat and drink lightly if you think  you are going into labor. ?If Braxton Hicks contractions are making you uncomfortable: ?Change your position from lying down or resting to walking, or change from walking to resting. ?Sit and rest in a tub of warm water. ?Drink enough fluid to keep your urine pale yellow. Dehydration may cause these contractions. ?Do slow and deep breathing several times an hour. ?Keep all follow-up prenatal visits as told by your health care provider. This is important. ?Contact a health care provider if: ?You have a fever. ?You have continuous pain in your abdomen. ?Get help right away if: ?Your contractions become stronger, more regular, and closer together. ?You have fluid leaking or gushing from your vagina. ?You pass  blood-tinged mucus (bloody show). ?You have bleeding from your vagina. ?You have low back pain that you never had before. ?You feel your baby?s head pushing down and causing pelvic pressure. ?Your baby is not m

## 2022-01-27 NOTE — Progress Notes (Signed)
? ? ?LOW-RISK PREGNANCY VISIT ?Patient name: KERY HALTIWANGER MRN [id number]  Date of birth: 06-08-2001 ?Chief Complaint:   ?Routine Prenatal Visit ? ?History of Present Illness:   ?LEZETTE KITTS is a 21 y.o. G1P0 female at [redacted]w[redacted]d with an Estimated Date of Delivery: 02/11/22 being seen today for ongoing management of a low-risk pregnancy.  ? ?Today she reports  went to [facility] last night w/ contractions, was 1/thick. Pt's mom who is w/ her, states [facility] is closer for them . Contractions: Not present. Vag. Bleeding: None.  Movement: Present. denies leaking of fluid. ? ? ?  11/12/2021  ?  8:45 AM 08/06/2021  ?  2:28 PM  ?Depression screen PHQ 2/9  ?Decreased Interest 1 1  ?Down, Depressed, Hopeless 3 1  ?PHQ - 2 Score 4 2  ?Altered sleeping 0 0  ?Tired, decreased energy 1 1  ?Change in appetite 0 3  ?Feeling bad or failure about yourself  3 0  ?Trouble concentrating 0 0  ?Moving slowly or fidgety/restless 0 0  ?Suicidal thoughts 0 0  ?PHQ-9 Score 8 6  ? ?  ? ?  11/12/2021  ?  8:45 AM 08/06/2021  ?  2:29 PM  ?GAD 7 : Generalized Anxiety Score  ?Nervous, Anxious, on Edge 1 1  ?Control/stop worrying 3 1  ?Worry too much - different things 3 1  ?Trouble relaxing 1 1  ?Restless 0 1  ?Easily annoyed or irritable 3 1  ?Afraid - awful might happen 0 0  ?Total GAD 7 Score 11 6  ? ? ?  ?Review of Systems:   ?Pertinent items are noted in HPI ?Denies abnormal vaginal discharge w/ itching/odor/irritation, headaches, visual changes, shortness of breath, chest pain, abdominal pain, severe nausea/vomiting, or problems with urination or bowel movements unless otherwise stated above. ?Pertinent History Reviewed:  ?Reviewed past medical,surgical, social, obstetrical and family history.  ?Reviewed problem list, medications and allergies. ?Physical Assessment:  ? ?Vitals:  ? 01/27/22 0958  ?BP: 114/69  ?Pulse: 87  ?Weight: 138 lb (62.6 kg)  ?Body mass index is 23.69 kg/m?. ?  ?     Physical Examination:  ? General appearance:  Well appearing, and in no distress ? Mental status: Alert, oriented to person, place, and time ? Skin: Warm & dry ? Cardiovascular: Normal heart rate noted ? Respiratory: Normal respiratory effort, no distress ? Abdomen: Soft, gravid, nontender ? Pelvic: Cervical exam deferred        ? Extremities: Edema: None ? ?Fetal Status: Fetal Heart Rate (bpm): 143 Fundal Height: 36 cm Movement: Present   ? ?Chaperone: N/A   ?No results found for this or any previous visit (from the past 24 hour(s)).  ?Assessment & Plan:  ?1) Low-risk pregnancy G1P0 at [redacted]w[redacted]d with an Estimated Date of Delivery: 02/11/22  ? ?2) False labor last night> discussed labor s/s, reasons to seek care, mom states will likely take her to [facility] when it is time d/t convenience. Discussed GBS is positive, so make sure to tell them on admission.  ?  ?Meds: No orders of the defined types were placed in this encounter. ? ?Labs/procedures today: none ? ?Plan:  Continue routine obstetrical care  ?Next visit: prefers in person   ? ?Reviewed: Term labor symptoms and general obstetric precautions including but not limited to vaginal bleeding, contractions, leaking of fluid and fetal movement were reviewed in detail with the patient.  All questions were answered. Does have home bp cuff. Office bp cuff given:  not applicable. Check bp weekly, let us know if consistently >140 and/or >90. ? ?Follow-up: Return for weekly, As scheduled. ? ?Future Appointments  ?Date Time Provider Department Center  ?02/03/2022  9:30 AM Cheral Marker, CNM [facility]  ?02/10/2022  9:10 AM Cheral Marker, CNM [facility]  ? ? ?No orders of the defined types were placed in this encounter. ? ?Cheral Marker CNM, WHNP-BC ?01/27/2022 ?10:24 AM  ?

## 2022-02-03 ENCOUNTER — Encounter: Payer: Medicaid Other | Admitting: Women's Health

## 2022-02-04 ENCOUNTER — Encounter: Payer: Medicaid Other | Admitting: Obstetrics & Gynecology

## 2022-02-10 ENCOUNTER — Other Ambulatory Visit (HOSPITAL_COMMUNITY)
Admission: RE | Admit: 2022-02-10 | Discharge: 2022-02-10 | Disposition: A | Discharge status: Home / Self Care | Payer: Medicaid Other | Source: Ambulatory Visit | Attending: Women's Health | Admitting: Women's Health

## 2022-02-10 ENCOUNTER — Ambulatory Visit (INDEPENDENT_AMBULATORY_CARE_PROVIDER_SITE_OTHER): Payer: Medicaid Other | Admitting: Women's Health

## 2022-02-10 ENCOUNTER — Encounter: Payer: Self-pay | Admitting: Women's Health

## 2022-02-10 VITALS — BP 102/69 | HR 89 | Wt 139.0 lb

## 2022-02-10 DIAGNOSIS — O26893 Other specified pregnancy related conditions, third trimester: Secondary | ICD-10-CM | POA: Diagnosis present

## 2022-02-10 DIAGNOSIS — N898 Other specified noninflammatory disorders of vagina: Secondary | ICD-10-CM | POA: Insufficient documentation

## 2022-02-10 DIAGNOSIS — Z3403 Encounter for supervision of normal first pregnancy, third trimester: Secondary | ICD-10-CM | POA: Diagnosis present

## 2022-02-10 MED ORDER — HYDROCORTISONE ACETATE 25 MG RE SUPP: 25.0000 mg | Freq: Two times a day (BID) | RECTAL | 0 refills | Status: DC

## 2022-02-10 NOTE — Progress Notes (Signed)
? ? ?LOW-RISK PREGNANCY VISIT ?Patient name: Megan Sandoval MRN [id number]  Date of birth: [birth year] ?Chief Complaint:   ?Routine Prenatal Visit ? ?History of Present Illness:   ?Megan Sandoval is a 21 y.o. G1P0 female at [redacted]w[redacted]d with an Estimated Date of Delivery: 02/11/22 being seen today for ongoing management of a low-risk pregnancy.  ? ?Today she reports  hemorrhoid, only thing that helps is warm baths, otc cream hasn't helped. Vaginal d/c with irritation . Went to [facility], was 1cm. Wants membranes swept.  Still plans to go to [facility] if spontaneous labor d/t convenience to her home. Contractions: Not present.  .  Movement: Present. denies leaking of fluid. ? ? ?  11/12/2021  ?  8:45 AM 08/06/2021  ?  2:28 PM  ?Depression screen PHQ 2/9  ?Decreased Interest 1 1  ?Down, Depressed, Hopeless 3 1  ?PHQ - 2 Score 4 2  ?Altered sleeping 0 0  ?Tired, decreased energy 1 1  ?Change in appetite 0 3  ?Feeling bad or failure about yourself  3 0  ?Trouble concentrating 0 0  ?Moving slowly or fidgety/restless 0 0  ?Suicidal thoughts 0 0  ?PHQ-9 Score 8 6  ? ?  ? ?  11/12/2021  ?  8:45 AM 08/06/2021  ?  2:29 PM  ?GAD 7 : Generalized Anxiety Score  ?Nervous, Anxious, on Edge 1 1  ?Control/stop worrying 3 1  ?Worry too much - different things 3 1  ?Trouble relaxing 1 1  ?Restless 0 1  ?Easily annoyed or irritable 3 1  ?Afraid - awful might happen 0 0  ?Total GAD 7 Score 11 6  ? ? ?  ?Review of Systems:   ?Pertinent items are noted in HPI ?Denies abnormal vaginal discharge w/ itching/odor/irritation, headaches, visual changes, shortness of breath, chest pain, abdominal pain, severe nausea/vomiting, or problems with urination or bowel movements unless otherwise stated above. ?Pertinent History Reviewed:  ?Reviewed past medical,surgical, social, obstetrical and family history.  ?Reviewed problem list, medications and allergies. ?Physical Assessment:  ? ?Vitals:  ? 02/10/22 0909  ?BP: 102/69  ?Pulse: 89  ?Weight: 139 lb (63  kg)  ?Body mass index is 23.86 kg/m?. ?  ?     Physical Examination:  ? General appearance: Well appearing, and in no distress ? Mental status: Alert, oriented to person, place, and time ? Skin: Warm & dry ? Cardiovascular: Normal heart rate noted ? Respiratory: Normal respiratory effort, no distress ? Abdomen: Soft, gravid, nontender ? Pelvic: d/c on vulva, spec exam- large amt thick clumpy yellow d/c c/w yeast; Cervical exam performed  Dilation: 1.5 Effacement (%): 50 Station: -2, Offered membrane sweeping, discussed r/b- pt decided to proceed, so membranes swept.  ? Rectal: ~1cm nonthrombosed but enlarged & tender hemorrhoid ?Extremities: Edema: None ? ?Fetal Status: Fetal Heart Rate (bpm): 143 Fundal Height: 37 cm Movement: Present Presentation: Vertex ? ?Chaperone: Faith Rogue   ?No results found for this or any previous visit (from the past 24 hour(s)).  ?Assessment & Plan:  ?1) Low-risk pregnancy G1P0 at [redacted]w[redacted]d with an Estimated Date of Delivery: 02/11/22  ? ?2) Vulvovaginal candida, use otc monistat 7, no sex ? ?3) Enlarged tender hemorrhoid> does not appear to be thrombosed, rx anusol, continue warm baths ?  ?Meds:  ?Meds ordered this encounter  ?Medications  ? hydrocortisone (ANUSOL-HC) 25 MG suppository  ?  Sig: Place 1 suppository (25 mg total) rectally 2 (two) times daily.  ?  Dispense:  12 suppository  ?  Refill:  0  ?  Order Specific Question:   Supervising Provider  ?  Answer:   Duane Lope H [[id number]]  ? ?Labs/procedures today: spec exam, CV swab, SVE, and membrane sweep ? ?Plan:  postdates NST Frid, scheduled IOL 5/24 AM (can't do OP FB- lives >44mins away).  IOL form faxed and orders placed  ?Next visit: prefers in person   ? ?Reviewed: Term labor symptoms and general obstetric precautions including but not limited to vaginal bleeding, contractions, leaking of fluid and fetal movement were reviewed in detail with the patient.  All questions were answered. Does have home bp cuff. Office bp cuff  given: not applicable. Check bp weekly, let us know if consistently >140 and/or >90. ? ?Follow-up: Return for Friday postdates NST w nurse, then next Mon or Tues lrob w/ cnm in office w/ nst. ? ?Future Appointments  ?Date Time Provider Department Center  ?02/13/2022 10:10 AM [facility] NURSE [facility]  ?02/17/2022  9:30 AM [facility] NURSE [facility]  ?02/17/2022 10:10 AM Cheral Marker, CNM [facility]  ? ? ?No orders of the defined types were placed in this encounter. ? ?Cheral Marker CNM, WHNP-BC ?02/10/2022 ?9:49 AM  ?

## 2022-02-10 NOTE — Patient Instructions (Signed)
[place], thank you for choosing our office today! We appreciate the opportunity to meet your healthcare needs. You may receive a short survey by mail, e-mail, or through [employer]. If you are happy with your care we would appreciate if you could take just a few minutes to complete the survey questions. We read all of your comments and take your feedback very seriously. Thank you again for choosing our office.  ?Center for [employer] Team at [facility] ? ?Women's & [place] at [facility] ?([address] [place], [place] [address]) ?Entrance C, located off of [address] ?Free 24/7 valet parking  ? ?CLASSES: Go to [email or web address] to register for classes (childbirth, breastfeeding, waterbirth, infant CPR, daddy bootcamp, etc.) ? ?Call the office [phone]) or go to [facility] if: ?You begin to have strong, frequent contractions ?Your water breaks.  Sometimes it is a big gush of fluid, sometimes it is just a trickle that keeps getting your panties wet or running down your legs ?You have vaginal bleeding.  It is normal to have a small amount of spotting if your cervix was checked.  ?You don't feel your baby moving like normal.  If you don't, get you something to eat and drink and lay down and focus on feeling your baby move.   If your baby is still not moving like normal, you should call the office or go to [facility] ? ?Call the office [phone]) or go to [facility] hospital for these signs of pre-eclampsia: ?Severe headache that does not go away with Tylenol ?Visual changes- seeing spots, double, blurred vision ?Pain under your right breast or upper abdomen that does not go away with Tums or heartburn medicine ?Nausea and/or vomiting ?Severe swelling in your hands, feet, and face  ? ?[place] Pediatricians/Family Doctors ?[facility]): [address] [place], [phone]           ?[facility]: [address], [phone]                 ?[facility]): [place], [phone] (call to ask if accepting patients) ?[facility] Department: [address], [place], [place]   ? ?[place] Pediatricians/Family Doctors ?[facility]): [address], [address], [phone] ?[place]: [address] [place], [phone] ?[facility]: [place], [phone] ? ?[place]  ?[facility]): [phone] ?[facility]: [place], [phone]  ? ?[place] ?[place]: [place] [address] [phone] ? ?[place]  ?[facility]: [phone], [phone] ? ?Home Blood Pressure Monitoring for Patients  ? ?Your provider has recommended that you check your blood pressure (BP) at least once a week at home. If you do not have a blood pressure cuff at home, one will be provided for you. Contact your provider if you have not received your monitor within 1 week.  ? ?Helpful Tips for Accurate Home Blood Pressure Checks  ?Don't smoke, exercise, or drink caffeine 30 minutes before checking your BP ?Use the restroom before checking your BP (a full bladder can raise your pressure) ?Relax in a comfortable upright chair ?Feet on the ground ?Left arm resting comfortably on a flat surface at the level of your heart ?Legs uncrossed ?Back supported ?Sit quietly and don't talk ?Place the cuff on your bare arm ?Adjust snuggly, so that only two fingertips  can fit between your skin and the top of the cuff ?Check 2 readings separated by at least one minute ?Keep a log of your BP readings ?For a visual, please reference this diagram: http://ccnc.care/bpdiagram ? ?Provider Name: [facility]     Phone: [phone] ? ?Zone 1: ALL CLEAR  ?Continue to monitor your symptoms:  ?BP reading is less than 140 (top number) or less than 90 (bottom number)  ?No right  upper stomach pain ?No headaches or seeing spots ?No feeling nauseated or throwing up ?No swelling in face and hands ? ?Zone 2: CAUTION ?Call your doctor's office for any of the following:  ?BP reading is greater than 140 (top number) or greater than 90 (bottom number)  ?Stomach pain under your ribs in the middle or right side ?Headaches or seeing spots ?Feeling nauseated or throwing up ?Swelling in face and hands ? ?Zone 3: EMERGENCY  ?Seek immediate medical care if you have any of the following:  ?BP reading is greater than160 (top number) or greater than 110 (bottom number) ?Severe headaches not improving with Tylenol ?Serious difficulty catching your breath ?Any worsening symptoms from Zone 2  ? ?Braxton Hicks Contractions ?Contractions of the uterus can occur throughout pregnancy, but they are not always a sign that you are in labor. You may have practice contractions called Braxton Hicks contractions. These false labor contractions are sometimes confused with true labor. ?What are [place] contractions? ?Braxton Hicks contractions are tightening movements that occur in the muscles of the uterus before labor. Unlike true labor contractions, these contractions do not result in opening (dilation) and thinning of the cervix. Toward the end of pregnancy (32-34 weeks), Braxton Hicks contractions can happen more often and may become stronger. These contractions are sometimes difficult to tell apart from true labor because they can be very uncomfortable. You should not feel embarrassed if you go to the hospital with false labor. ?Sometimes, the only way to tell if you are in true labor is for your health care provider to look for changes in the cervix. The health care provider will do a physical exam and may monitor your contractions. If you are not in true labor, the exam should show that your cervix is not dilating and your water has not broken. ?If there are no other health problems associated with your  pregnancy, it is completely safe for you to be sent home with false labor. You may continue to have Braxton Hicks contractions until you go into true labor. ?How to tell the difference between true labor and false labor ?True labor ?Contractions last 30-70 seconds. ?Contractions become very regular. ?Discomfort is usually felt in the top of the uterus, and it spreads to the lower abdomen and low back. ?Contractions do not go away with walking. ?Contractions usually become more intense and increase in frequency. ?The cervix dilates and gets thinner. ?False labor ?Contractions are usually shorter and not as strong as true labor contractions. ?Contractions are usually irregular. ?Contractions are often felt in the front of the lower abdomen and in the groin. ?Contractions may go away when you walk around or change positions while lying down. ?Contractions get weaker and are shorter-lasting as time goes on. ?The cervix usually does not dilate or become thin. ?Follow these instructions at home: ? ?Take over-the-counter and prescription medicines only as told by your health care provider. ?Keep up with your usual exercises and follow other instructions from your health care provider. ?Eat and drink lightly if you think  you are going into labor. ?If Braxton Hicks contractions are making you uncomfortable: ?Change your position from lying down or resting to walking, or change from walking to resting. ?Sit and rest in a tub of warm water. ?Drink enough fluid to keep your urine pale yellow. Dehydration may cause these contractions. ?Do slow and deep breathing several times an hour. ?Keep all follow-up prenatal visits as told by your health care provider. This is important. ?Contact a health care provider if: ?You have a fever. ?You have continuous pain in your abdomen. ?Get help right away if: ?Your contractions become stronger, more regular, and closer together. ?You have fluid leaking or gushing from your vagina. ?You pass  blood-tinged mucus (bloody show). ?You have bleeding from your vagina. ?You have low back pain that you never had before. ?You feel your baby?s head pushing down and causing pelvic pressure. ?Your baby is not m

## 2022-02-11 LAB — CERVICOVAGINAL ANCILLARY ONLY
Bacterial Vaginitis (gardnerella): NEGATIVE
Candida Glabrata: NEGATIVE
Candida Vaginitis: POSITIVE — AB
Comment: NEGATIVE
Comment: NEGATIVE
Comment: NEGATIVE
Comment: NEGATIVE
Trichomonas: NEGATIVE

## 2022-02-12 ENCOUNTER — Other Ambulatory Visit: Payer: Self-pay | Admitting: Advanced Practice Midwife

## 2022-02-12 ENCOUNTER — Telehealth (HOSPITAL_COMMUNITY): Payer: Self-pay | Admitting: *Deleted

## 2022-02-12 NOTE — Telephone Encounter (Signed)
Preadmission screen  

## 2022-02-13 ENCOUNTER — Telehealth (HOSPITAL_COMMUNITY): Payer: Self-pay | Admitting: *Deleted

## 2022-02-13 ENCOUNTER — Other Ambulatory Visit: Payer: Medicaid Other

## 2022-02-13 NOTE — Telephone Encounter (Signed)
Preadmission screen  

## 2022-02-16 ENCOUNTER — Telehealth (HOSPITAL_COMMUNITY): Payer: Self-pay | Admitting: *Deleted

## 2022-02-16 NOTE — Telephone Encounter (Signed)
Preadmission screen  

## 2022-02-17 ENCOUNTER — Other Ambulatory Visit: Payer: Medicaid Other

## 2022-02-17 ENCOUNTER — Other Ambulatory Visit: Payer: Medicaid Other | Admitting: Women's Health

## 2022-02-17 ENCOUNTER — Telehealth: Payer: Self-pay | Admitting: *Deleted

## 2022-02-17 NOTE — Telephone Encounter (Signed)
Patient's mom called to let us know that Megan Sandoval had her baby at [facility] on 02/13/22. She had a vaginal birth. Patient's phone is out of service and she was there with her mom. I went on and scheduled her postpartum appointment. Please advise if you need any more information. Mom said you could call her for more information from Megan Sandoval.

## 2022-02-18 ENCOUNTER — Inpatient Hospital Stay (HOSPITAL_COMMUNITY)
Admission: AD | Admit: 2022-02-18 | Payer: Medicaid Other | Source: Home / Self Care | Admitting: Obstetrics and Gynecology

## 2022-02-18 ENCOUNTER — Inpatient Hospital Stay (HOSPITAL_COMMUNITY): Payer: Medicaid Other

## 2022-03-24 ENCOUNTER — Ambulatory Visit (INDEPENDENT_AMBULATORY_CARE_PROVIDER_SITE_OTHER): Payer: Medicaid Other | Admitting: Women's Health

## 2022-03-24 ENCOUNTER — Encounter: Payer: Self-pay | Admitting: Women's Health

## 2022-03-24 DIAGNOSIS — Z30013 Encounter for initial prescription of injectable contraceptive: Secondary | ICD-10-CM | POA: Diagnosis not present

## 2022-03-24 MED ORDER — MEDROXYPROGESTERONE ACETATE 150 MG/ML IM SUSP
150.0000 mg | Freq: Once | INTRAMUSCULAR | Status: AC
  Administered 2022-03-24: 150 mg via INTRAMUSCULAR

## 2022-03-24 MED ORDER — MEDROXYPROGESTERONE ACETATE 150 MG/ML IM SUSP: 150.0000 mg | INTRAMUSCULAR | 3 refills | Status: DC

## 2022-06-09 ENCOUNTER — Ambulatory Visit: Payer: Medicaid Other

## 2022-06-10 ENCOUNTER — Ambulatory Visit (INDEPENDENT_AMBULATORY_CARE_PROVIDER_SITE_OTHER): Payer: Medicaid Other | Admitting: *Deleted

## 2022-06-10 DIAGNOSIS — Z3042 Encounter for surveillance of injectable contraceptive: Secondary | ICD-10-CM

## 2022-06-10 MED ORDER — MEDROXYPROGESTERONE ACETATE 150 MG/ML IM SUSP
150.0000 mg | Freq: Once | INTRAMUSCULAR | Status: AC
  Administered 2022-06-10: 150 mg via INTRAMUSCULAR

## 2022-06-10 NOTE — Progress Notes (Signed)
   NURSE VISIT- INJECTION  SUBJECTIVE:  Megan Sandoval is a 21 y.o. G44P1001 female here for a Depo Provera for contraception/period management. She is a GYN patient.   OBJECTIVE:  There were no vitals taken for this visit.  Appears well, in no apparent distress  Injection administered in: Right deltoid  Meds ordered this encounter  Medications   medroxyPROGESTERone (DEPO-PROVERA) injection 150 mg    ASSESSMENT: GYN patient Depo Provera for contraception/period management PLAN: Follow-up: in 11-13 weeks for next Depo   Jobe Marker  06/10/2022 4:35 PM

## 2022-06-19 ENCOUNTER — Encounter: Payer: Self-pay | Admitting: Emergency Medicine

## 2022-06-19 ENCOUNTER — Ambulatory Visit
Admission: EM | Admit: 2022-06-19 | Discharge: 2022-06-19 | Disposition: A | Discharge status: Home / Self Care | Payer: Medicaid Other | Attending: Family Medicine | Admitting: Family Medicine

## 2022-06-19 DIAGNOSIS — J069 Acute upper respiratory infection, unspecified: Secondary | ICD-10-CM | POA: Diagnosis not present

## 2022-06-19 DIAGNOSIS — Z20822 Contact with and (suspected) exposure to covid-19: Secondary | ICD-10-CM | POA: Diagnosis not present

## 2022-06-19 DIAGNOSIS — J029 Acute pharyngitis, unspecified: Secondary | ICD-10-CM | POA: Diagnosis not present

## 2022-06-19 LAB — RESP PANEL BY RT-PCR (FLU A&B, COVID) ARPGX2
Influenza A by PCR: NEGATIVE
Influenza B by PCR: NEGATIVE
SARS Coronavirus 2 by RT PCR: NEGATIVE

## 2022-06-19 LAB — POCT RAPID STREP A (OFFICE): Rapid Strep A Screen: NEGATIVE

## 2022-06-19 NOTE — ED Triage Notes (Signed)
Runny nose, sore throat, and cough since yesterday.

## 2022-06-19 NOTE — ED Provider Notes (Signed)
[facility]    CSN: [id number] Arrival date & time: 06/19/22  1340      History   Chief Complaint No chief complaint on file.   HPI Megan Sandoval is a 21 y.o. female.   Presenting today with 1 day history of runny nose, sore throat, cough, chills.  Denies chest pain, shortness of breath, abdominal pain, nausea vomiting or diarrhea.  So far not trying anything over-the-counter for symptoms.  States her work wants to make sure that she does not have strep or COVID before going back to work.  No known sick contacts that she is aware of.  No known pertinent chronic medical problems per patient.    Past Medical History:  Diagnosis Date   Medical history non-contributory     There are no problems to display for this patient.   Past Surgical History:  Procedure Laterality Date   NO PAST SURGERIES      OB History     Gravida  1   Para  1   Term  1   Preterm      AB      Living  1      SAB      IAB      Ectopic      Multiple      Live Births  1            Home Medications    Prior to Admission medications   Medication Sig Start Date End Date Taking? Authorizing Provider  acetaminophen (TYLENOL) 500 MG tablet Take 1,000 mg by mouth every 6 (six) hours as needed. Patient not taking: Reported on 03/24/2022    [provider]  Blood Pressure Monitor MISC For regular home bp monitoring during pregnancy Patient not taking: Reported on 03/24/2022 08/06/21   Arabella Merles, CNM  ferrous sulfate 325 (65 FE) MG tablet Take 1 tablet (325 mg total) by mouth every other day. Patient not taking: Reported on 03/24/2022 11/13/21   Cheral Marker, CNM  hydrocortisone (ANUSOL-HC) 25 MG suppository Place 1 suppository (25 mg total) rectally 2 (two) times daily. Patient not taking: Reported on 03/24/2022 02/10/22   Cheral Marker, CNM  medroxyPROGESTERone (DEPO-PROVERA) 150 MG/ML injection Inject 1 mL (150 mg total) into the muscle every 3  (three) months. 03/24/22   Cheral Marker, CNM  Prenatal Vit-Fe Fumarate-FA (PRENATAL VITAMIN PO) Take by mouth. Patient not taking: Reported on 03/24/2022    [provider]    Family History Family History  Problem Relation Age of Onset   Down syndrome Brother    Lung cancer Maternal Grandmother     Social History Social History   Tobacco Use   Smoking status: Never   Smokeless tobacco: Never  Vaping Use   Vaping Use: Never used  Substance Use Topics   Alcohol use: No   Drug use: Not Currently    Types: Marijuana     Allergies   Patient has no known allergies.   Review of Systems Review of Systems HPI  Physical Exam Triage Vital Signs ED Triage Vitals [06/19/22 1425]  Enc Vitals Group     BP 138/78     Pulse Rate (!) 106     Resp 16     Temp 99.3 F (37.4 C)     Temp Source Oral     SpO2 96 %     Weight      Height  Head Circumference      Peak Flow      Pain Score 3     Pain Loc      Pain Edu?      Excl. in [place]?    No data found.  Updated Vital Signs BP 138/78 (BP Location: Right Arm)   Pulse (!) 106   Temp 99.3 F (37.4 C) (Oral)   Resp 16   SpO2 96%   Breastfeeding No   Visual Acuity Right Eye Distance:   Left Eye Distance:   Bilateral Distance:    Right Eye Near:   Left Eye Near:    Bilateral Near:     Physical Exam Vitals and nursing note reviewed.  Constitutional:      Appearance: Normal appearance.  HENT:     Head: Atraumatic.     Right Ear: Tympanic membrane and external ear normal.     Left Ear: Tympanic membrane and external ear normal.     Nose: Rhinorrhea present.     Mouth/Throat:     Mouth: Mucous membranes are moist.     Pharynx: Posterior oropharyngeal erythema present.  Eyes:     Extraocular Movements: Extraocular movements intact.     Conjunctiva/sclera: Conjunctivae normal.  Cardiovascular:     Rate and Rhythm: Normal rate and regular rhythm.     Heart sounds: Normal heart sounds.   Pulmonary:     Effort: Pulmonary effort is normal.     Breath sounds: Normal breath sounds. No wheezing.  Musculoskeletal:        General: Normal range of motion.     Cervical back: Normal range of motion and neck supple.  Lymphadenopathy:     Cervical: No cervical adenopathy.  Skin:    General: Skin is warm and dry.  Neurological:     Mental Status: She is alert and oriented to person, place, and time.  Psychiatric:        Mood and Affect: Mood normal.        Thought Content: Thought content normal.      UC Treatments / Results  Labs (all labs ordered are listed, but only abnormal results are displayed) Labs Reviewed  CULTURE, GROUP A STREP ([place])  RESP PANEL BY RT-PCR (FLU A&B, COVID) [id number]  POCT RAPID STREP A (OFFICE)    EKG   Radiology No results found.  Procedures Procedures (including critical care time)  Medications Ordered in UC Medications - No data to display  Initial Impression / Assessment and Plan / UC Course  I have reviewed the triage vital signs and the nursing notes.  Pertinent labs & imaging results that were available during my care of the patient were reviewed by me and considered in my medical decision making (see chart for details).     Vitals and exam overall reassuring and suspicious for viral upper respiratory infection.  Rapid strep negative, throat culture and respiratory panel pending.  Discussed over-the-counter supportive medications and home care, work note given.  Return for worsening symptoms.  Final Clinical Impressions(s) / UC Diagnoses   Final diagnoses:  Viral URI   Discharge Instructions   None    ED Prescriptions   None    PDMP not reviewed this encounter.   Volney American, [place] 06/19/22 1459

## 2022-06-22 LAB — CULTURE, GROUP A STREP (THRC)

## 2022-08-23 IMAGING — US US BREAST BX W LOC DEV 1ST LESION IMG BX SPEC US GUIDE*L*
1 series · 6 of 6 positions shown · non-contrast
Comparison: Previous exam(s).
COMPARISON: Previous exam(s).

Addendum:
CLINICAL DATA: 19-year-old female with an indeterminate mass in the
left breast at the [DATE] position.

EXAM:
ULTRASOUND GUIDED LEFT BREAST CORE NEEDLE BIOPSY

[Series 1: us breast bx w loc dev 1st lesion img bx spec us g · 0.08mm/px · 6 of 6 slices shown]
[im 1/6]
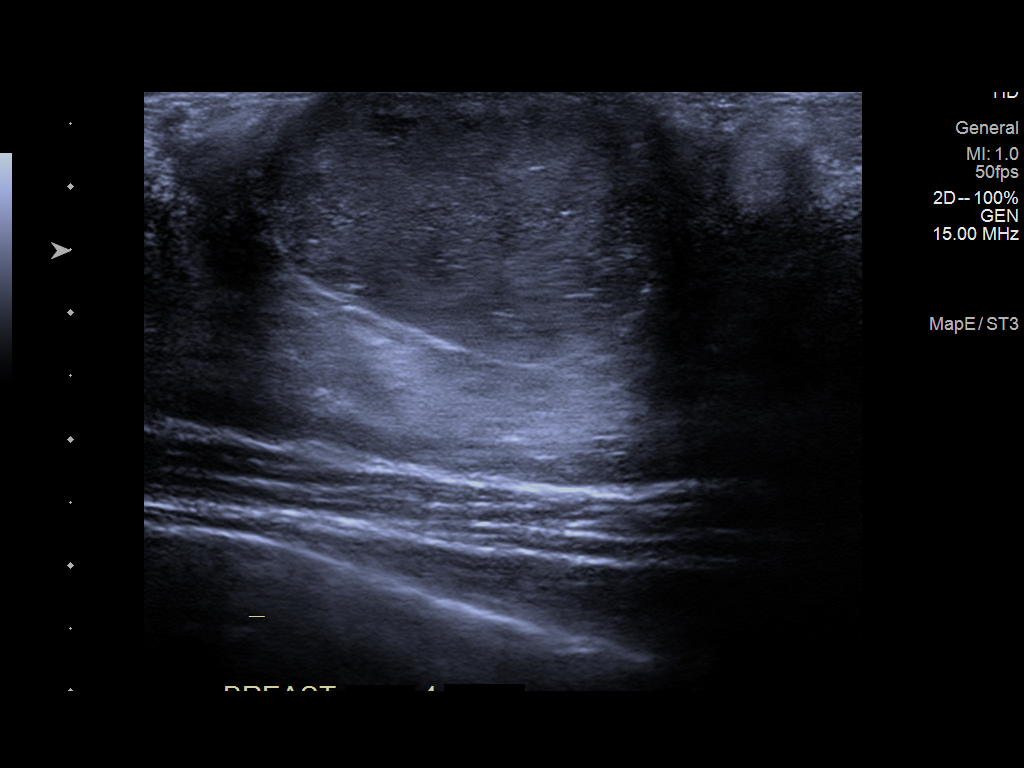
[im 2/6]
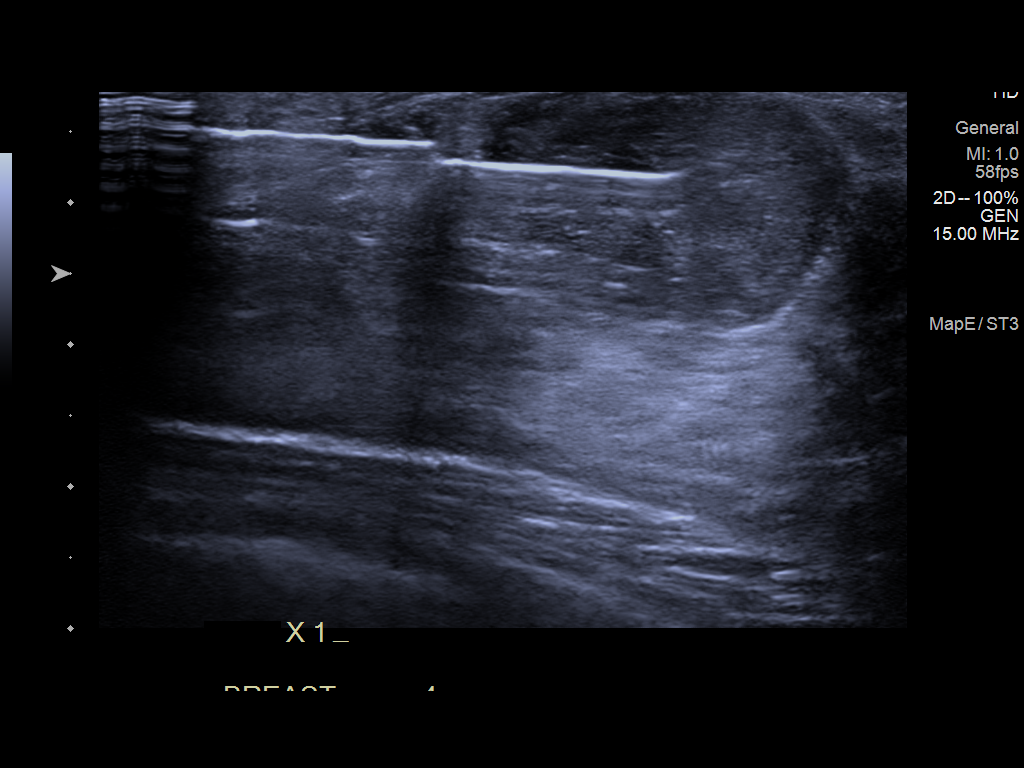
[im 3/6]
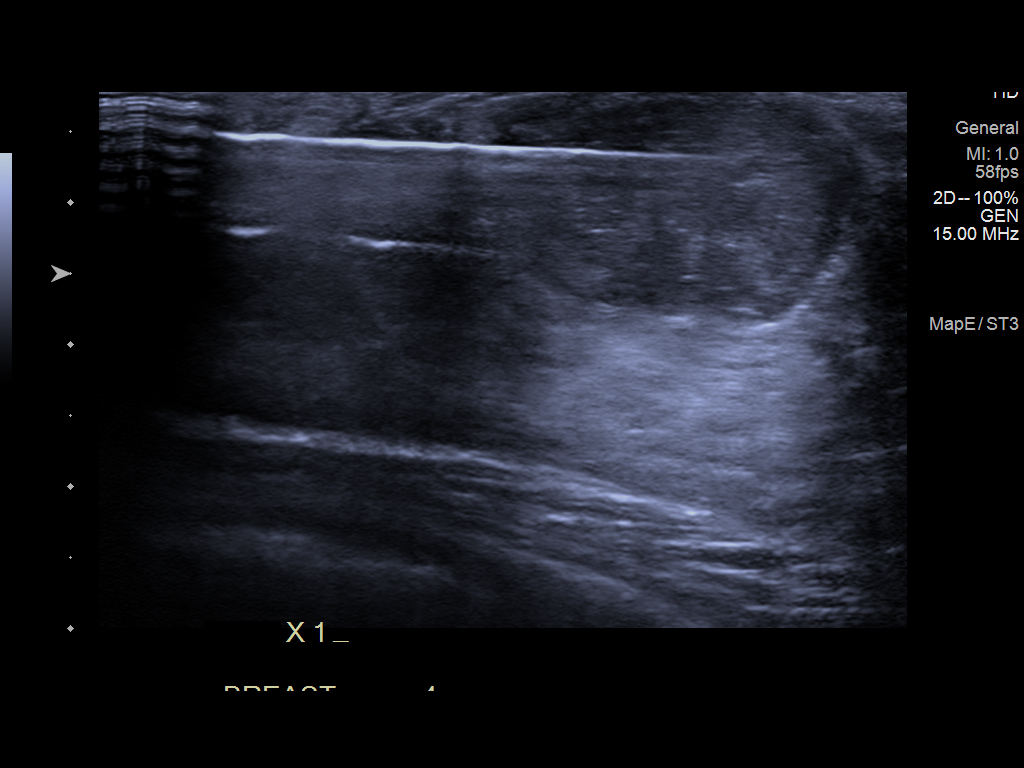
[im 4/6]
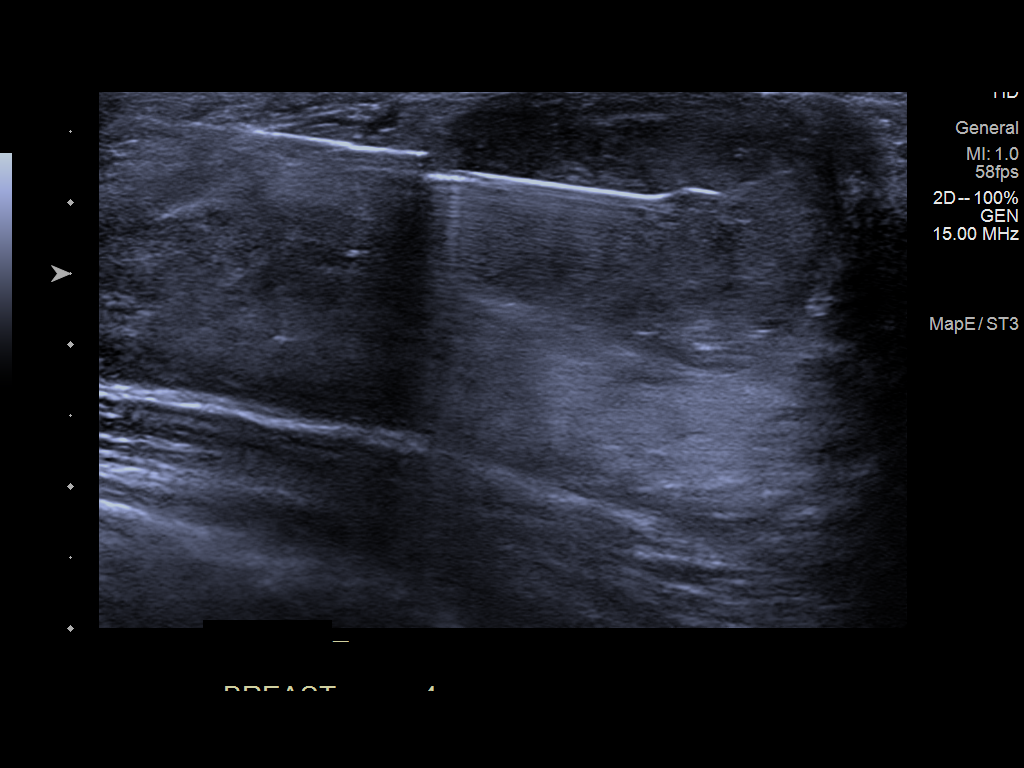
[im 5/6]
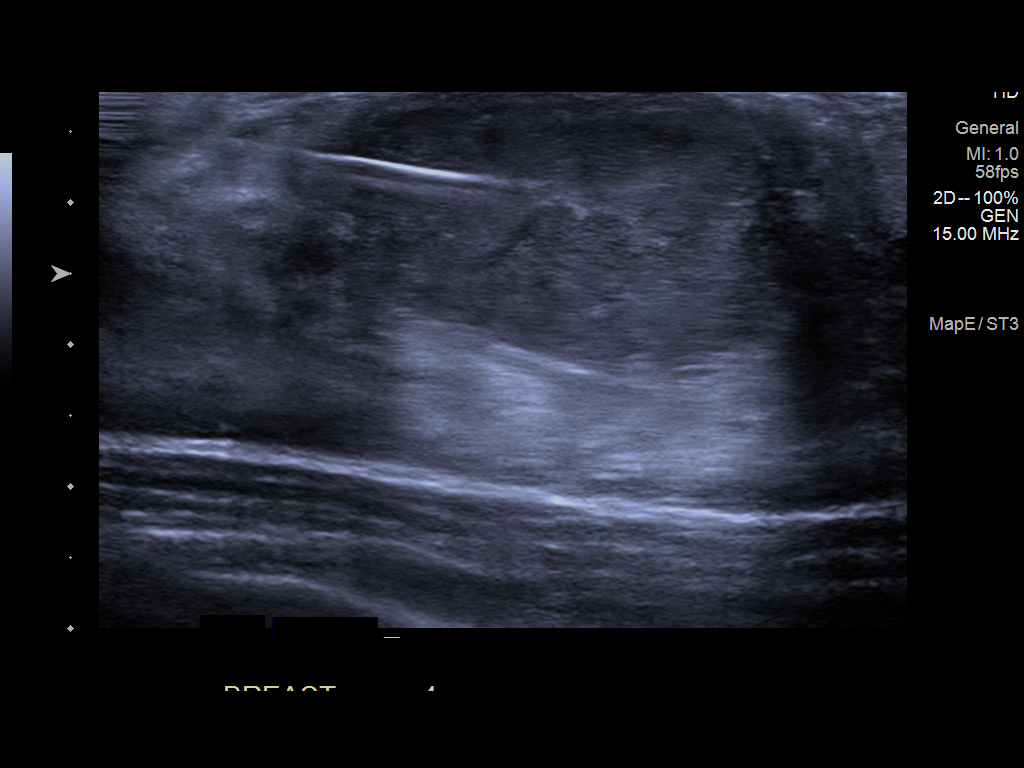
[im 6/6]
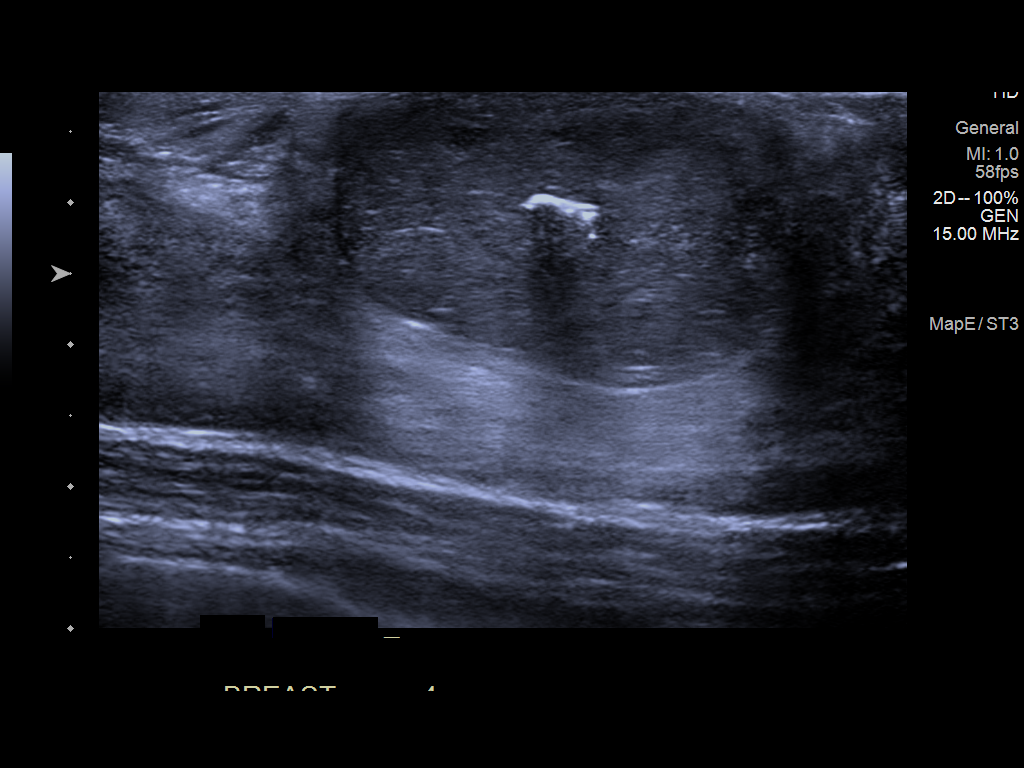

[6 of 6 positions shown; findings below may reference images not displayed]



Lesion quadrant: Upper inner

Using sterile technique and 1% Lidocaine as local anesthetic, under
direct ultrasound visualization, a 14 gauge [employer]works device was
used to perform biopsy of the mass in the upper inner left breast
using a medial to lateral approach. At the conclusion of the
procedure a ribbon shaped tissue marker clip was deployed into the
biopsy cavity. Follow up 2 view mammogram was performed and dictated
separately.
IMPRESSION: Ultrasound guided biopsy of the mass in the left breast at the [DATE]
position. No apparent complications.

ADDENDUM:
PATHOLOGY revealed: A. BREAST, LEFT, MASS [DATE], BIOPSY: -
Fibroadenoma.

Pathology results are CONCORDANT with imaging findings, per Dr.
Ferienhaus Erxleben.

Pathology results and recommendations were discussed with patient
via telephone on 07/23/2021. Patient reported biopsy site doing well
with no adverse symptoms, and only slight tenderness at the site.
Post biopsy care instructions were reviewed, questions were answered
and my direct phone number was provided. Patient was instructed to
call [HOSPITAL] [HOSPITAL] Mammography Department for any
additional questions or concerns related to biopsy site.

RECOMMENDATION: Patient instructed to continue with monthly self
breast examinations, clinical follow-up for any subsequent concerns,
and begin bilateral screening mammogram at age 41.

Pathology results reported by Bijukumar Alisaee RN on 07/23/2021.



Lesion quadrant: Upper inner

Using sterile technique and 1% Lidocaine as local anesthetic, under
direct ultrasound visualization, a 14 gauge Apyworks device was
used to perform biopsy of the mass in the upper inner left breast
using a medial to lateral approach. At the conclusion of the
procedure a ribbon shaped tissue marker clip was deployed into the
biopsy cavity. Follow up 2 view mammogram was performed and dictated
separately.
IMPRESSION: Ultrasound guided biopsy of the mass in the left breast at the [DATE]
position. No apparent complications.

## 2022-08-27 ENCOUNTER — Ambulatory Visit (INDEPENDENT_AMBULATORY_CARE_PROVIDER_SITE_OTHER): Payer: Medicaid Other | Admitting: *Deleted

## 2022-08-27 DIAGNOSIS — Z3042 Encounter for surveillance of injectable contraceptive: Secondary | ICD-10-CM

## 2022-08-27 MED ORDER — MEDROXYPROGESTERONE ACETATE 150 MG/ML IM SUSP
150.0000 mg | Freq: Once | INTRAMUSCULAR | Status: AC
  Administered 2022-08-27: 150 mg via INTRAMUSCULAR

## 2022-08-27 NOTE — Progress Notes (Signed)
   NURSE VISIT- INJECTION  SUBJECTIVE:  Megan Sandoval is a 21 y.o. G24P1001 female here for a Depo Provera for contraception/period management. She is a GYN patient.   OBJECTIVE:  There were no vitals taken for this visit.  Appears well, in no apparent distress  Injection administered in: Left deltoid  Meds ordered this encounter  Medications   medroxyPROGESTERone (DEPO-PROVERA) injection 150 mg    ASSESSMENT: GYN patient Depo Provera for contraception/period management PLAN: Follow-up: in 11-13 weeks for next Depo   Jobe Marker  08/27/2022 10:18 AM

## 2022-11-18 ENCOUNTER — Ambulatory Visit: Payer: Medicaid Other

## 2022-11-26 ENCOUNTER — Ambulatory Visit: Payer: Medicaid Other

## 2022-11-27 ENCOUNTER — Ambulatory Visit: Payer: Medicaid Other

## 2023-11-17 ENCOUNTER — Ambulatory Visit: Payer: Self-pay | Admitting: Women's Health

## 2023-12-02 ENCOUNTER — Encounter: Payer: Self-pay | Admitting: Women's Health

## 2023-12-02 ENCOUNTER — Ambulatory Visit (INDEPENDENT_AMBULATORY_CARE_PROVIDER_SITE_OTHER): Payer: Self-pay | Admitting: Women's Health

## 2023-12-02 ENCOUNTER — Other Ambulatory Visit (HOSPITAL_COMMUNITY)
Admission: RE | Admit: 2023-12-02 | Discharge: 2023-12-02 | Disposition: A | Discharge status: Home / Self Care | Payer: Self-pay | Source: Ambulatory Visit | Attending: Women's Health | Admitting: Women's Health

## 2023-12-02 VITALS — BP 101/64 | HR 84 | Ht 64.0 in | Wt 125.0 lb

## 2023-12-02 DIAGNOSIS — Z3201 Encounter for pregnancy test, result positive: Secondary | ICD-10-CM

## 2023-12-02 DIAGNOSIS — Z01419 Encounter for gynecological examination (general) (routine) without abnormal findings: Secondary | ICD-10-CM | POA: Insufficient documentation

## 2023-12-02 DIAGNOSIS — Z3491 Encounter for supervision of normal pregnancy, unspecified, first trimester: Secondary | ICD-10-CM

## 2023-12-02 DIAGNOSIS — Z3490 Encounter for supervision of normal pregnancy, unspecified, unspecified trimester: Secondary | ICD-10-CM | POA: Insufficient documentation

## 2023-12-02 DIAGNOSIS — D242 Benign neoplasm of left breast: Secondary | ICD-10-CM

## 2023-12-02 LAB — POCT URINE PREGNANCY: Preg Test, Ur: POSITIVE — AB

## 2023-12-02 NOTE — Patient Instructions (Signed)
Megan Sandoval, thank you for choosing our office today! We appreciate the opportunity to meet your healthcare needs. You may receive a short survey by mail, e-mail, or through MyChart. If you are happy with your care we would appreciate if you could take just a few minutes to complete the survey questions. We read all of your comments and take your feedback very seriously. Thank you again for choosing our office.  [facility] ([address] [place], [place] [address]) Entrance C, located off of [address] Free 24/7 valet parking   Nausea & Vomiting Have saltine crackers or pretzels by your bed and eat a few bites before you raise your head out of bed in the morning Eat small frequent meals throughout the day instead of large meals Drink plenty of fluids throughout the day to stay hydrated, just don't drink a lot of fluids with your meals.  This can make your stomach fill up faster making you feel sick Do not brush your teeth right after you eat Products with real ginger are good for nausea, like ginger ale and ginger hard candy Make sure it says made with real ginger! Sucking on sour candy like lemon heads is also good for nausea If your prenatal vitamins make you nauseated, take them at night so you will sleep through the nausea Sea Bands If you feel like you need medicine for the nausea & vomiting please let us know If you are unable to keep any fluids or food down please let us know   Constipation Drink plenty of fluid, preferably water, throughout the day Eat foods high in fiber such as fruits, vegetables, and grains Exercise, such as walking, is a good way to keep your bowels regular Drink warm fluids, especially warm prune juice, or decaf coffee Eat a 1/2 cup of real oatmeal (not instant), 1/2 cup applesauce, and 1/2-1 cup warm prune juice every day If needed, you may take Colace (docusate sodium) stool softener  once or twice a day to help keep the stool soft.  If you still are having problems with constipation, you may take Miralax once daily as needed to help keep your bowels regular.   Home Blood Pressure Monitoring for Patients   Your provider has recommended that you check your blood pressure (BP) at least once a week at home. If you do not have a blood pressure cuff at home, one will be provided for you. Contact your provider if you have not received your monitor within 1 week.   Helpful Tips for Accurate Home Blood Pressure Checks  Don't smoke, exercise, or drink caffeine 30 minutes before checking your BP Use the restroom before checking your BP (a full bladder can raise your pressure) Relax in a comfortable upright chair Feet on the ground Left arm resting comfortably on a flat surface at the level of your heart Legs uncrossed Back supported Sit quietly and don't talk Place the cuff on your bare arm Adjust snuggly, so that only two fingertips can fit between your skin and the top of the cuff Check 2 readings separated by at least one minute Keep a log of your BP readings For a visual, please reference this diagram: http://ccnc.care/bpdiagram  Provider Name: [facility]     Phone: [phone]  Zone 1: ALL CLEAR  Continue to monitor your symptoms:  BP reading is less than 140 (top number) or less than 90 (bottom   number)  No right upper stomach pain No headaches or seeing spots No feeling nauseated or throwing up No swelling in face and hands  Zone 2: CAUTION Call your doctor's office for any of the following:  BP reading is greater than 140 (top number) or greater than 90 (bottom number)  Stomach pain under your ribs in the middle or right side Headaches or seeing spots Feeling nauseated or throwing up Swelling in face and hands  Zone 3: EMERGENCY  Seek immediate medical care if you have any of the following:  BP reading is greater than160 (top number) or greater than  110 (bottom number) Severe headaches not improving with Tylenol Serious difficulty catching your breath Any worsening symptoms from Zone 2    First Trimester of Pregnancy The first trimester of pregnancy is from week 1 until the end of week 12 (months 1 through 3). A week after a sperm fertilizes an egg, the egg will implant on the wall of the uterus. This embryo will begin to develop into a baby. Genes from you and your partner are forming the baby. The female genes determine whether the baby is a boy or a girl. At 6-8 weeks, the eyes and face are formed, and the heartbeat can be seen on ultrasound. At the end of 12 weeks, all the baby's organs are formed.  Now that you are pregnant, you will want to do everything you can to have a healthy baby. Two of the most important things are to get good prenatal care and to follow your health care provider's instructions. Prenatal care is all the medical care you receive before the baby's birth. This care will help prevent, find, and treat any problems during the pregnancy and childbirth. BODY CHANGES Your body goes through many changes during pregnancy. The changes vary from woman to woman.  You may gain or lose a couple of pounds at first. You may feel sick to your stomach (nauseous) and throw up (vomit). If the vomiting is uncontrollable, call your health care provider. You may tire easily. You may develop headaches that can be relieved by medicines approved by your health care provider. You may urinate more often. Painful urination may mean you have a bladder infection. You may develop heartburn as a result of your pregnancy. You may develop constipation because certain hormones are causing the muscles that push waste through your intestines to slow down. You may develop hemorrhoids or swollen, bulging veins (varicose veins). Your breasts may begin to grow larger and become tender. Your nipples may stick out more, and the tissue that surrounds them  (areola) may become darker. Your gums may bleed and may be sensitive to brushing and flossing. Dark spots or blotches (chloasma, mask of pregnancy) may develop on your face. This will likely fade after the baby is born. Your menstrual periods will stop. You may have a loss of appetite. You may develop cravings for certain kinds of food. You may have changes in your emotions from day to day, such as being excited to be pregnant or being concerned that something may go wrong with the pregnancy and baby. You may have more vivid and strange dreams. You may have changes in your hair. These can include thickening of your hair, rapid growth, and changes in texture. Some women also have hair loss during or after pregnancy, or hair that feels dry or thin. Your hair will most likely return to normal after your baby is born. WHAT TO EXPECT AT YOUR PRENATAL  VISITS During a routine prenatal visit: You will be weighed to make sure you and the baby are growing normally. Your blood pressure will be taken. Your abdomen will be measured to track your baby's growth. The fetal heartbeat will be listened to starting around week 10 or 12 of your pregnancy. Test results from any previous visits will be discussed. Your health care provider may ask you: How you are feeling. If you are feeling the baby move. If you have had any abnormal symptoms, such as leaking fluid, bleeding, severe headaches, or abdominal cramping. If you have any questions. Other tests that may be performed during your first trimester include: Blood tests to find your blood type and to check for the presence of any previous infections. They will also be used to check for low iron levels (anemia) and Rh antibodies. Later in the pregnancy, blood tests for diabetes will be done along with other tests if problems develop. Urine tests to check for infections, diabetes, or protein in the urine. An ultrasound to confirm the proper growth and development  of the baby. An amniocentesis to check for possible genetic problems. Fetal screens for spina bifida and Down syndrome. You may need other tests to make sure you and the baby are doing well. HOME CARE INSTRUCTIONS  Medicines Follow your health care provider's instructions regarding medicine use. Specific medicines may be either safe or unsafe to take during pregnancy. Take your prenatal vitamins as directed. If you develop constipation, try taking a stool softener if your health care provider approves. Diet Eat regular, well-balanced meals. Choose a variety of foods, such as meat or vegetable-based protein, fish, milk and low-fat dairy products, vegetables, fruits, and whole grain breads and cereals. Your health care provider will help you determine the amount of weight gain that is right for you. Avoid raw meat and uncooked cheese. These carry germs that can cause birth defects in the baby. Eating four or five small meals rather than three large meals a day may help relieve nausea and vomiting. If you start to feel nauseous, eating a few soda crackers can be helpful. Drinking liquids between meals instead of during meals also seems to help nausea and vomiting. If you develop constipation, eat more high-fiber foods, such as fresh vegetables or fruit and whole grains. Drink enough fluids to keep your urine clear or pale yellow. Activity and Exercise Exercise only as directed by your health care provider. Exercising will help you: Control your weight. Stay in shape. Be prepared for labor and delivery. Experiencing pain or cramping in the lower abdomen or low back is a good sign that you should stop exercising. Check with your health care provider before continuing normal exercises. Try to avoid standing for long periods of time. Move your legs often if you must stand in one place for a long time. Avoid heavy lifting. Wear low-heeled shoes, and practice good posture. You may continue to have sex  unless your health care provider directs you otherwise. Relief of Pain or Discomfort Wear a good support bra for breast tenderness.   Take warm sitz baths to soothe any pain or discomfort caused by hemorrhoids. Use hemorrhoid cream if your health care provider approves.   Rest with your legs elevated if you have leg cramps or low back pain. If you develop varicose veins in your legs, wear support hose. Elevate your feet for 15 minutes, 3-4 times a day. Limit salt in your diet. Prenatal Care Schedule your prenatal visits by the  twelfth week of pregnancy. They are usually scheduled monthly at first, then more often in the last 2 months before delivery. Write down your questions. Take them to your prenatal visits. Keep all your prenatal visits as directed by your health care provider. Safety Wear your seat belt at all times when driving. Make a list of emergency phone numbers, including numbers for family, friends, the hospital, and police and fire departments. General Tips Ask your health care provider for a referral to a local prenatal education class. Begin classes no later than at the beginning of month 6 of your pregnancy. Ask for help if you have counseling or nutritional needs during pregnancy. Your health care provider can offer advice or refer you to specialists for help with various needs. Do not use hot tubs, steam rooms, or saunas. Do not douche or use tampons or scented sanitary pads. Do not cross your legs for long periods of time. Avoid cat litter boxes and soil used by cats. These carry germs that can cause birth defects in the baby and possibly loss of the fetus by miscarriage or stillbirth. Avoid all smoking, herbs, alcohol, and medicines not prescribed by your health care provider. Chemicals in these affect the formation and growth of the baby. Schedule a dentist appointment. At home, brush your teeth with a soft toothbrush and be gentle when you floss. SEEK MEDICAL CARE IF:   You have dizziness. You have mild pelvic cramps, pelvic pressure, or nagging pain in the abdominal area. You have persistent nausea, vomiting, or diarrhea. You have a bad smelling vaginal discharge. You have pain with urination. You notice increased swelling in your face, hands, legs, or ankles. SEEK IMMEDIATE MEDICAL CARE IF:  You have a fever. You are leaking fluid from your vagina. You have spotting or bleeding from your vagina. You have severe abdominal cramping or pain. You have rapid weight gain or loss. You vomit blood or material that looks like coffee grounds. You are exposed to [place] measles and have never had them. You are exposed to fifth disease or chickenpox. You develop a severe headache. You have shortness of breath. You have any kind of trauma, such as from a fall or a car accident. Document Released: 09/08/2001 Document Revised: 01/29/2014 Document Reviewed: 07/25/2013 [facility] Patient Information 2015 [place], [place]. This information is not intended to replace advice given to you by your health care provider. Make sure you discuss any questions you have with your health care provider.

## 2023-12-02 NOTE — Progress Notes (Signed)
 WELL-WOMAN EXAMINATION Patient name: Megan Sandoval MRN [id number]  Date of birth: [birth year] Chief Complaint:   Annual Exam and Contraception  History of Present Illness:   Megan Sandoval is a 23 y.o. G78P1001 African-American female at [redacted]w[redacted]d by certain LMP, being seen today for a routine well-woman exam. Did not know she was pregnant. Was wanting to talk about birth control.  Current complaints: some mild nausea, eating more, feels tired  PCP: Zhou-Talbert      does not desire labs Patient's last menstrual period was 11/07/2023. The current method of family planning is coitus interruptus.  Last pap never. Results were: N/A. H/O abnormal pap: no Last mammogram: never. 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 Family h/o breast cancer: no Last colonoscopy: never. Results were: N/A. Family h/o colorectal cancer: no     12/02/2023   10:36 AM 11/12/2021    8:45 AM 08/06/2021    2:28 PM  Depression screen PHQ 2/9  Decreased Interest 1 1 1   Down, Depressed, Hopeless 1 3 1   PHQ - 2 Score 2 4 2   Altered sleeping 0 0 0  Tired, decreased energy 3 1 1   Change in appetite 0 0 3  Feeling bad or failure about yourself  0 3 0  Trouble concentrating 0 0 0  Moving slowly or fidgety/restless 1 0 0  Suicidal thoughts 0 0 0  PHQ-9 Score 6 8 6         12/02/2023   10:36 AM 11/12/2021    8:45 AM 08/06/2021    2:29 PM  GAD 7 : Generalized Anxiety Score  Nervous, Anxious, on Edge 1 1 1   Control/stop worrying 1 3 1   Worry too much - different things 1 3 1   Trouble relaxing 0 1 1  Restless 0 0 1  Easily annoyed or irritable 3 3 1   Afraid - awful might happen 0 0 0  Total GAD 7 Score 6 11 6      Review of Systems:   Pertinent items are noted in HPI Denies any headaches, blurred vision, fatigue, shortness of breath, chest pain, abdominal pain, abnormal vaginal discharge/itching/odor/irritation, problems with periods, bowel movements, urination, or intercourse unless otherwise stated  above. Pertinent History Reviewed:  Reviewed past medical,surgical, social and family history.  Reviewed problem list, medications and allergies. Physical Assessment:   Vitals:   12/02/23 1038  BP: 101/64  Pulse: 84  Weight: 125 lb (56.7 kg)  Height: 5\' 4"  (1.626 m)  Body mass index is 21.46 kg/m.        Physical Examination:   General appearance - well appearing, and in no distress  Mental status - alert, oriented to person, place, and time  Psych:  She has a normal mood and affect  Skin - warm and dry, normal color, no suspicious lesions noted  Chest - effort normal, all lung fields clear to auscultation bilaterally  Heart - normal rate and regular rhythm  Neck:  midline trachea, no thyromegaly or nodules  Breasts - Rt breast appears normal, no suspicious masses, no skin or nipple changes or  axillary nodes; Lt breast w/ mass 39fb above nipple at 12 o'clock c/w known fibroadenoma  Abdomen - soft, nontender, nondistended, no masses or organomegaly  Pelvic - VULVA: normal appearing vulva with no masses, tenderness or lesions  VAGINA: normal appearing vagina with normal color and discharge, no lesions  CERVIX: normal appearing cervix without discharge or lesions, no CMT  Thin prep pap is done w/ reflex HR  HPV cotesting  UTERUS: uterus is felt to be normal size, shape, consistency and nontender   ADNEXA: No adnexal masses or tenderness noted.  Extremities:  No swelling or varicosities noted  Chaperone: Jobe Marker  Results for orders placed or performed in visit on 12/02/23 (from the past 24 hours)  POCT urine pregnancy   Collection Time: 12/02/23 10:50 AM  Result Value Ref Range   Preg Test, Ur Positive (A) Negative    Assessment & Plan:  1) Well-Woman Exam  2) [redacted]w[redacted]d pregnant> by LMP, start pnv, will get dating u/s, gave NewOB packet, declines nausea meds right now- will let us know if needs  3) Known Lt fibroadenoma  Labs/procedures today: pap  Mammogram: @ 22yo, or  sooner if problems Colonoscopy: @ 22yo, or sooner if problems  Orders Placed This Encounter  Procedures   US OB Comp Less 14 Wks   POCT urine pregnancy    Meds: No orders of the defined types were placed in this encounter.   Follow-up: Return for 3-4wk for dating u/s.  Cheral Marker CNM, [facility] 12/02/2023 11:39 AM

## 2023-12-06 LAB — CYTOLOGY - PAP
Chlamydia: NEGATIVE
Comment: NEGATIVE
Comment: NEGATIVE
Comment: NORMAL
Diagnosis: NEGATIVE
Neisseria Gonorrhea: NEGATIVE
Trichomonas: NEGATIVE

## 2023-12-23 ENCOUNTER — Other Ambulatory Visit: Payer: Self-pay | Admitting: Radiology

## 2024-01-13 ENCOUNTER — Ambulatory Visit: Payer: Self-pay | Admitting: Advanced Practice Midwife

## 2024-04-20 ENCOUNTER — Ambulatory Visit (INDEPENDENT_AMBULATORY_CARE_PROVIDER_SITE_OTHER): Payer: Self-pay

## 2024-04-20 VITALS — BP 107/70 | HR 78 | Ht 64.0 in | Wt 113.0 lb

## 2024-04-20 DIAGNOSIS — Z789 Other specified health status: Secondary | ICD-10-CM | POA: Diagnosis not present

## 2024-04-20 DIAGNOSIS — Z3201 Encounter for pregnancy test, result positive: Secondary | ICD-10-CM | POA: Diagnosis not present

## 2024-04-20 LAB — POCT URINE PREGNANCY: Preg Test, Ur: POSITIVE — AB

## 2024-04-20 MED ORDER — DOXYLAMINE-PYRIDOXINE 10-10 MG PO TBEC: DELAYED_RELEASE_TABLET | ORAL | 6 refills | Status: AC

## 2024-04-20 NOTE — Progress Notes (Signed)
   NURSE VISIT- PREGNANCY CONFIRMATION   SUBJECTIVE:  Megan Sandoval is a 23 y.o. G60P1011 female at Unknown by uncertain LMP of No LMP recorded (lmp unknown). Patient is pregnant. Here for pregnancy confirmation.  Home pregnancy test: positive x 1  She reports nausea.  She is not taking prenatal vitamins.    OBJECTIVE:  BP 107/70 (BP Location: Right Arm, Patient Position: Sitting, Cuff Size: Normal)   Pulse 78   Ht 5' 4 (1.626 m)   Wt 113 lb (51.3 kg)   LMP  (LMP Unknown) Comment: last month unsure exact date  BMI 19.40 kg/m   Appears well, in no apparent distress  Results for orders placed or performed in visit on 04/20/24 (from the past 24 hours)  POCT urine pregnancy   Collection Time: 04/20/24  9:35 AM  Result Value Ref Range   Preg Test, Ur Positive (A) Negative    ASSESSMENT: Positive pregnancy test, Unknown by LMP    PLAN: Schedule for dating ultrasound once HCG levels come back Prenatal vitamins: plans to begin OTC ASAP   Nausea medicines: requested-note routed to Luke Fetters CNM to send prescription   OB packet given: Yes  Aleck FORBES Blase  04/20/2024 9:36 AM

## 2024-04-20 NOTE — Addendum Note (Signed)
 Addended by: KIZZIE SUZEN SAUNDERS on: 04/20/2024 12:34 PM   Modules accepted: Orders

## 2024-04-21 LAB — BETA HCG QUANT (REF LAB): hCG Quant: 347 m[IU]/mL

## 2024-04-24 ENCOUNTER — Encounter: Payer: Self-pay | Admitting: Advanced Practice Midwife

## 2024-04-24 ENCOUNTER — Telehealth: Payer: Self-pay

## 2024-04-24 NOTE — Telephone Encounter (Signed)
 Patient called and stated that she would like to speak with a nurse about her HCG levels; she  has been waiting since Thursday.

## 2024-04-24 NOTE — Telephone Encounter (Signed)
 Returned patient's call. Informed based on HCG, she is between 4-6 weeks.  U/S appt made. All questions answered.

## 2024-04-25 ENCOUNTER — Ambulatory Visit: Payer: Self-pay | Admitting: Women's Health

## 2024-05-22 ENCOUNTER — Other Ambulatory Visit: Payer: Self-pay

## 2024-07-08 ENCOUNTER — Ambulatory Visit
Admission: EM | Admit: 2024-07-08 | Discharge: 2024-07-08 | Disposition: A | Discharge status: Home / Self Care | Attending: Family Medicine | Admitting: Family Medicine

## 2024-07-08 DIAGNOSIS — B001 Herpesviral vesicular dermatitis: Secondary | ICD-10-CM | POA: Insufficient documentation

## 2024-07-08 DIAGNOSIS — N760 Acute vaginitis: Secondary | ICD-10-CM | POA: Insufficient documentation

## 2024-07-08 DIAGNOSIS — N898 Other specified noninflammatory disorders of vagina: Secondary | ICD-10-CM | POA: Diagnosis present

## 2024-07-08 MED ORDER — VALACYCLOVIR HCL 1 G PO TABS: 1000.0000 mg | ORAL_TABLET | Freq: Two times a day (BID) | ORAL | 0 refills | Status: AC

## 2024-07-08 NOTE — Discharge Instructions (Signed)
 I have sent in an antiviral tablet for the cold sore on your lip.  As we discussed, this is contagious until the sore is resolved so try to avoid direct contact or sharing beverages.  We will let you know when the vaginal swab results come back and if anything comes back positive and we will treat you based on these results.

## 2024-07-08 NOTE — ED Provider Notes (Signed)
 [facility]    CSN: [id number] Arrival date & time: 07/08/24  1113      History   Chief Complaint No chief complaint on file.   HPI Megan Sandoval is a 23 y.o. female.   Patient presenting today with 3-week history of vaginal discharge and odor.  Denies pelvic pain, dysuria, hematuria, flank pain, nausea, vomiting, rashes, unprotected sexual encounters recently.  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  Using the over-the-counter cold sore ointment with minimal relief.  States recent termination of pregnancy, no concern for pregnancy today.    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 Authorizing Provider  escitalopram (LEXAPRO) 10 MG tablet 1 tablet Orally Once a day; Duration: 30 days 06/01/24  Yes [provider]  valACYclovir (VALTREX) 1000 MG tablet Take 1 tablet (1,000 mg total) by mouth 2 (two) times daily for 10 days. 07/08/24 07/18/24 Yes Stuart Vernell Norris, PA-C  acetaminophen  (TYLENOL ) 500 MG tablet Take 1,000 mg by mouth every 6 (six) hours as needed. Patient not taking: Reported on 04/20/2024    [provider]  Doxylamine -Pyridoxine  (DICLEGIS ) 10-10 MG TBEC 2 tabs q hs, if sx persist add 1 tab q am on day 3, if sx persist add 1 tab q afternoon on day 4 04/20/24   Kizzie Suzen SAUNDERS, CNM     Family History Family History  Problem Relation Age of Onset   Down syndrome Brother    Lung cancer Maternal Grandmother     Social History Social History   Tobacco Use   Smoking status: Never   Smokeless tobacco: Never  Vaping Use   Vaping status: Never Used  Substance Use Topics   Alcohol use: No   Drug use: Yes    Types: Marijuana    Comment: before pregnancy     Allergies   Patient has no known allergies.   Review of Systems Review of Systems Per HPI  Physical Exam Triage Vital Signs ED Triage Vitals [07/08/24 1222]  Encounter Vitals Group     BP (!) 103/51     Girls Systolic BP Percentile      Girls Diastolic BP Percentile      Boys Systolic BP Percentile      Boys Diastolic BP Percentile      Pulse Rate 87     Resp 18     Temp 98.1 F (36.7 C)     Temp Source Oral     SpO2  99 %     Weight      Height      Head Circumference      Peak Flow      Pain Score 0     Pain Loc      Pain Education      Exclude from Growth Chart    No data found.  Updated Vital Signs BP (!) 103/51 (BP Location: Right Arm)   Pulse 87   Temp 98.1 F (36.7 C) (Oral)   Resp 18   LMP  (LMP Unknown) Comment: recent term of preg  SpO2 99%   Breastfeeding Unknown   Visual Acuity Right Eye Distance:   Left Eye Distance:   Bilateral Distance:    Right Eye Near:   Left Eye Near:    Bilateral Near:     Physical Exam Vitals and nursing note reviewed.  Constitutional:      Appearance: Normal appearance. She is not ill-appearing.  HENT:     Head: Atraumatic.     Mouth/Throat:     Mouth: Mucous membranes are moist.     Comments: Blistered/ulcerated lesion to the left upper lip Eyes:     Extraocular Movements: Extraocular movements intact.     Conjunctiva/sclera: Conjunctivae normal.  Cardiovascular:     Rate and Rhythm: Normal rate.  Pulmonary:     Effort: Pulmonary effort is normal.  Genitourinary:    Comments: GU exam deferred, self swab  performed Musculoskeletal:        General: Normal range of motion.     Cervical back: Normal range of motion and neck supple.  Skin:    General: Skin is warm and dry.  Neurological:     Mental Status: She is alert and oriented to person, place, and time.  Psychiatric:        Mood and Affect: Mood normal.        Thought Content: Thought content normal.        Judgment: Judgment normal.      UC Treatments / Results  Labs (all labs ordered are listed, but only abnormal results are displayed) Labs Reviewed  CERVICOVAGINAL ANCILLARY ONLY    EKG   Radiology No results found.  Procedures Procedures (including critical care time)  Medications Ordered in UC Medications - No data to display  Initial Impression / Assessment and Plan / UC Course  I have reviewed the triage vital signs and the nursing notes.  Pertinent labs & imaging results that were available during my care of the patient were reviewed by me and considered in my medical decision making (see chart for details).     Vaginal swab pending, declines HIV and syphilis labs today as she has recently had this with no encounters since.  Will treat based on results and discussed supportive home care additionally.  Will treat with Valtrex for the cold sore to the left upper lip.  Return for worsening symptoms.  Final Clinical Impressions(s) / UC Diagnoses   Final diagnoses:  Cold sore  Acute vaginitis     Discharge Instructions      I have sent in an antiviral tablet for the cold sore on your lip.  As we discussed, this is contagious until the sore is resolved so try to avoid direct contact or sharing beverages.  We will let you know when the vaginal swab results come back and if anything comes back positive and we will treat you based on these results.    ED Prescriptions  Medication Sig Dispense Auth. Provider   valACYclovir (VALTREX) 1000 MG tablet Take 1 tablet (1,000 mg total) by mouth 2 (two) times  daily for 10 days. 20 tablet Stuart Vernell Norris, [place]      PDMP not reviewed this encounter.   Stuart Vernell Norris, [place] 07/08/24 1325

## 2024-07-08 NOTE — ED Triage Notes (Signed)
 Pt reports she has vaginal discharge, slight odor and a sore on her lip x 3 weeks  Reports recent termination of preg Denies any unprotected sexual encounters recently.

## 2024-07-10 ENCOUNTER — Ambulatory Visit (HOSPITAL_COMMUNITY): Payer: Self-pay

## 2024-07-10 LAB — CERVICOVAGINAL ANCILLARY ONLY
Bacterial Vaginitis (gardnerella): POSITIVE — AB
Candida Glabrata: NEGATIVE
Candida Vaginitis: NEGATIVE
Chlamydia: NEGATIVE
Comment: NEGATIVE
Comment: NEGATIVE
Comment: NEGATIVE
Comment: NEGATIVE
Comment: NEGATIVE
Comment: NORMAL
Neisseria Gonorrhea: NEGATIVE
Trichomonas: NEGATIVE

## 2024-07-10 MED ORDER — METRONIDAZOLE 500 MG PO TABS: 500.0000 mg | ORAL_TABLET | Freq: Two times a day (BID) | ORAL | 0 refills | Status: AC
